# Patient Record
Sex: Female | Born: 1965 | Race: White | Hispanic: No | Marital: Married | Smoking: Former smoker
Health system: Southern US, Community
[De-identification: ages and names within clinical notes are randomized; demographics above are authoritative.]

## PROBLEM LIST (undated history)

## (undated) DIAGNOSIS — E039 Hypothyroidism, unspecified: Secondary | ICD-10-CM

## (undated) DIAGNOSIS — B2790 Infectious mononucleosis, unspecified without complication: Secondary | ICD-10-CM

## (undated) DIAGNOSIS — F329 Major depressive disorder, single episode, unspecified: Secondary | ICD-10-CM

## (undated) DIAGNOSIS — F32A Depression, unspecified: Secondary | ICD-10-CM

## (undated) DIAGNOSIS — G43909 Migraine, unspecified, not intractable, without status migrainosus: Secondary | ICD-10-CM

## (undated) DIAGNOSIS — K219 Gastro-esophageal reflux disease without esophagitis: Secondary | ICD-10-CM

## (undated) HISTORY — DX: Migraine, unspecified, not intractable, without status migrainosus: G43.909

## (undated) HISTORY — DX: Depression, unspecified: F32.A

## (undated) HISTORY — DX: Major depressive disorder, single episode, unspecified: F32.9

## (undated) HISTORY — DX: Infectious mononucleosis, unspecified without complication: B27.90

## (undated) HISTORY — PX: SPINE SURGERY: SHX786

## (undated) HISTORY — DX: Gastro-esophageal reflux disease without esophagitis: K21.9

## (undated) HISTORY — PX: BREAST SURGERY: SHX581

## (undated) HISTORY — DX: Hypothyroidism, unspecified: E03.9

---

## 1999-04-19 ENCOUNTER — Encounter: Payer: Self-pay | Admitting: Emergency Medicine

## 1999-04-19 ENCOUNTER — Emergency Department (HOSPITAL_COMMUNITY): Admission: EM | Admit: 1999-04-19 | Discharge: 1999-04-19 | Payer: Self-pay | Admitting: Emergency Medicine

## 1999-04-21 ENCOUNTER — Encounter: Payer: Self-pay | Admitting: Neurosurgery

## 1999-04-23 ENCOUNTER — Inpatient Hospital Stay (HOSPITAL_COMMUNITY): Admission: RE | Admit: 1999-04-23 | Discharge: 1999-04-23 | Payer: Self-pay | Admitting: Neurosurgery

## 1999-04-23 ENCOUNTER — Encounter: Payer: Self-pay | Admitting: Neurosurgery

## 1999-05-18 ENCOUNTER — Ambulatory Visit (HOSPITAL_COMMUNITY): Admission: RE | Admit: 1999-05-18 | Discharge: 1999-05-18 | Payer: Self-pay | Admitting: Neurosurgery

## 1999-05-18 ENCOUNTER — Encounter: Payer: Self-pay | Admitting: Neurosurgery

## 1999-06-03 ENCOUNTER — Encounter: Payer: Self-pay | Admitting: Neurosurgery

## 1999-06-03 ENCOUNTER — Ambulatory Visit (HOSPITAL_COMMUNITY): Admission: RE | Admit: 1999-06-03 | Discharge: 1999-06-03 | Payer: Self-pay | Admitting: Neurosurgery

## 2000-04-07 ENCOUNTER — Encounter: Payer: Self-pay | Admitting: Obstetrics & Gynecology

## 2000-04-07 ENCOUNTER — Ambulatory Visit (HOSPITAL_COMMUNITY): Admission: RE | Admit: 2000-04-07 | Discharge: 2000-04-07 | Payer: Self-pay | Admitting: Obstetrics & Gynecology

## 2001-08-14 ENCOUNTER — Other Ambulatory Visit: Admission: RE | Admit: 2001-08-14 | Discharge: 2001-08-14 | Payer: Self-pay | Admitting: Gynecology

## 2002-10-04 ENCOUNTER — Inpatient Hospital Stay (HOSPITAL_COMMUNITY): Admission: AD | Admit: 2002-10-04 | Discharge: 2002-10-07 | Payer: Self-pay | Admitting: Obstetrics & Gynecology

## 2002-10-08 ENCOUNTER — Encounter: Admission: RE | Admit: 2002-10-08 | Discharge: 2002-11-07 | Payer: Self-pay | Admitting: Obstetrics and Gynecology

## 2006-01-13 ENCOUNTER — Ambulatory Visit (HOSPITAL_COMMUNITY): Admission: RE | Admit: 2006-01-13 | Discharge: 2006-01-13 | Payer: Self-pay | Admitting: Obstetrics and Gynecology

## 2006-01-19 ENCOUNTER — Ambulatory Visit (HOSPITAL_COMMUNITY): Admission: RE | Admit: 2006-01-19 | Discharge: 2006-01-19 | Payer: Self-pay | Admitting: Obstetrics and Gynecology

## 2006-01-19 ENCOUNTER — Encounter (INDEPENDENT_AMBULATORY_CARE_PROVIDER_SITE_OTHER): Payer: Self-pay | Admitting: Specialist

## 2006-05-24 ENCOUNTER — Ambulatory Visit: Payer: Self-pay | Admitting: Family Medicine

## 2006-10-03 ENCOUNTER — Ambulatory Visit: Payer: Self-pay | Admitting: Family Medicine

## 2006-12-05 ENCOUNTER — Ambulatory Visit: Payer: Self-pay | Admitting: Family Medicine

## 2006-12-05 DIAGNOSIS — J45909 Unspecified asthma, uncomplicated: Secondary | ICD-10-CM | POA: Insufficient documentation

## 2006-12-21 ENCOUNTER — Ambulatory Visit: Payer: Self-pay | Admitting: Family Medicine

## 2007-01-30 ENCOUNTER — Ambulatory Visit: Payer: Self-pay | Admitting: Family Medicine

## 2007-02-25 IMAGING — US US OB TRANSVAGINAL
1 series · 14 of 21 positions shown · non-contrast
Comparison: none

CLINICAL DATA: Confirm spontaneous abortion; EGA by LMP is 8 weeks 2 days.
 TRANSVAGINAL OBSTETRICAL US:
TECHNIQUE: Transvaginal ultrasound was performed for evaluation of the gestation as well as the maternal uterus and adnexal regions.

[Series 1: us ob transvaginal · 0.19mm/px · 14 of 21 slices shown]
[im 1/21]
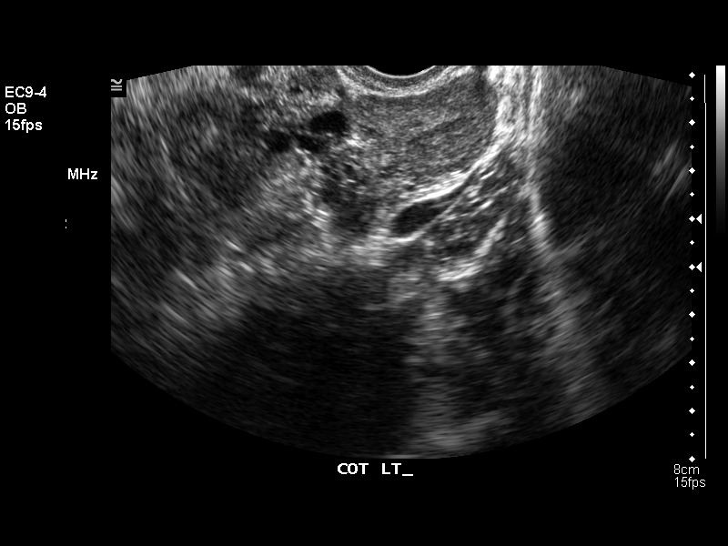
[im 3/21]
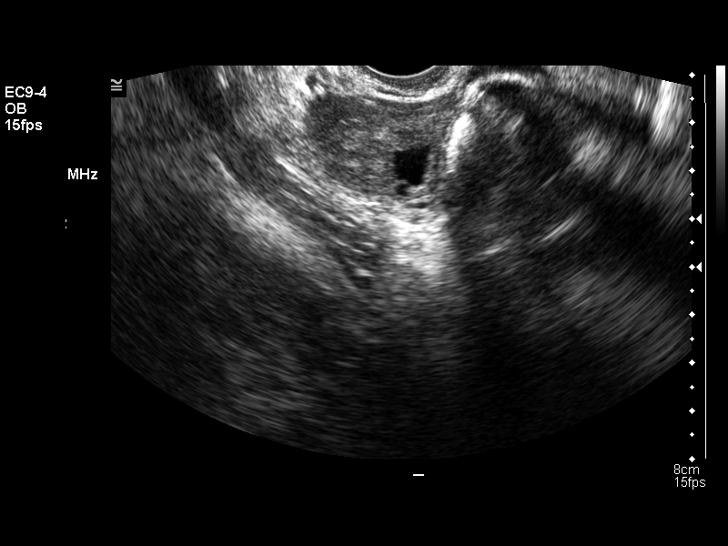
[im 4/21]
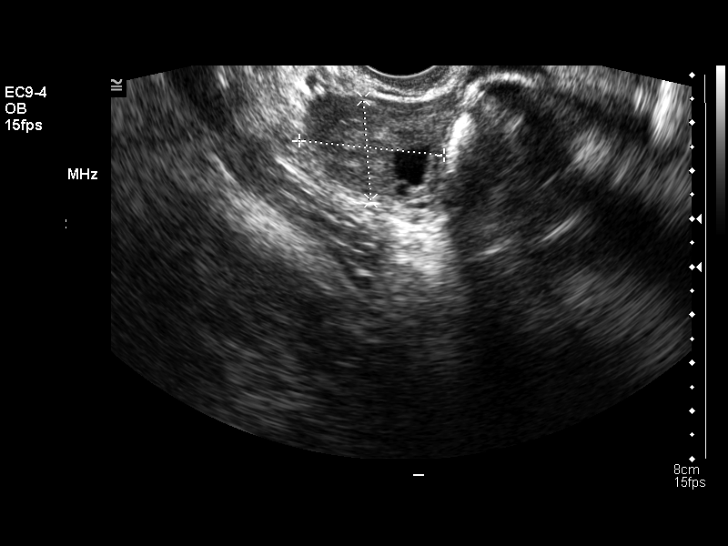
[im 6/21]
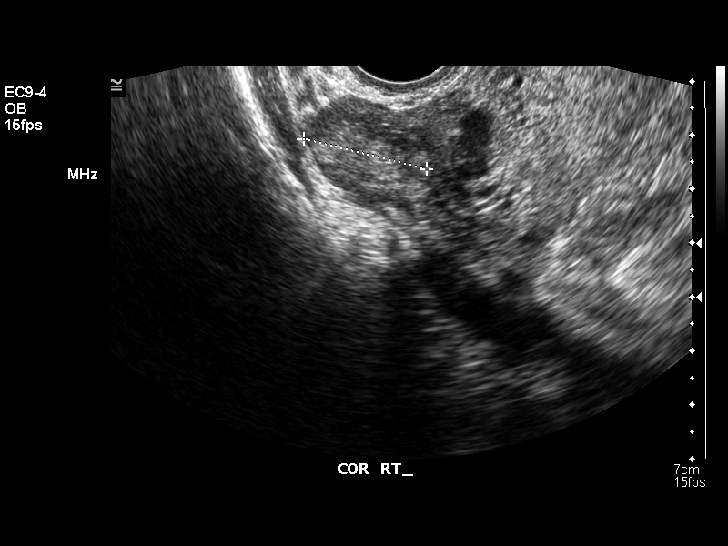
[im 7/21]
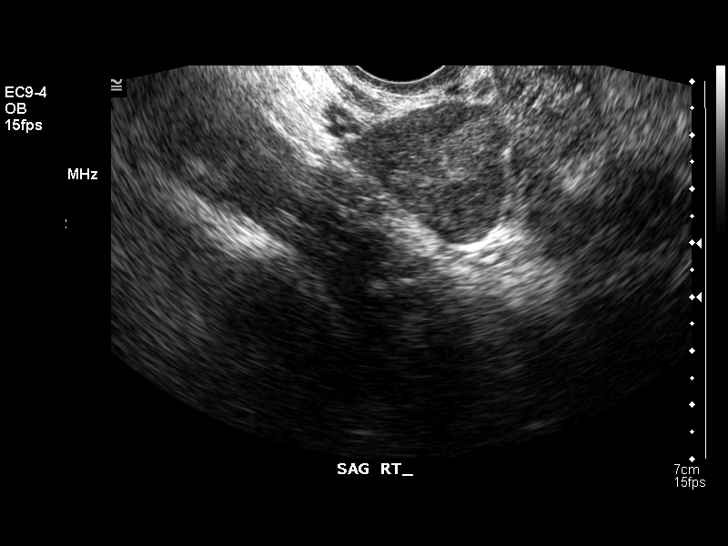
[im 9/21]
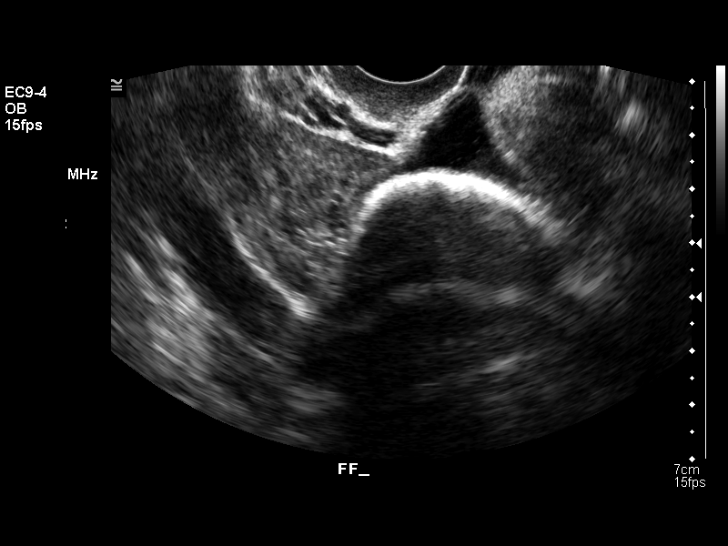
[im 10/21]
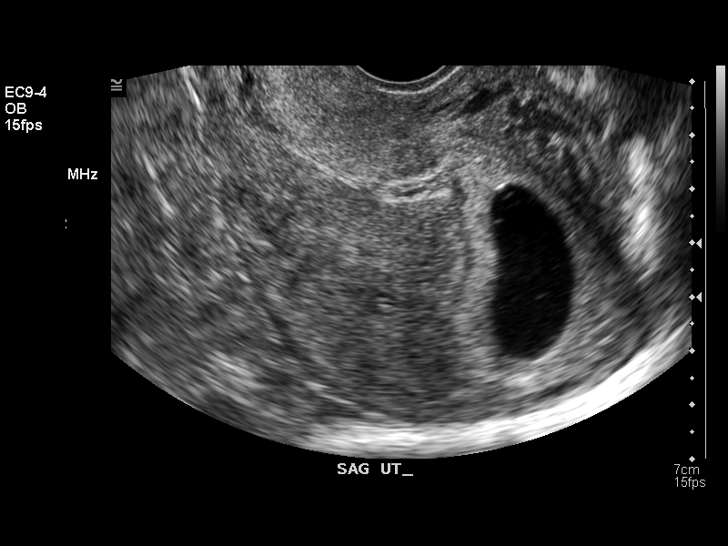
[im 12/21]
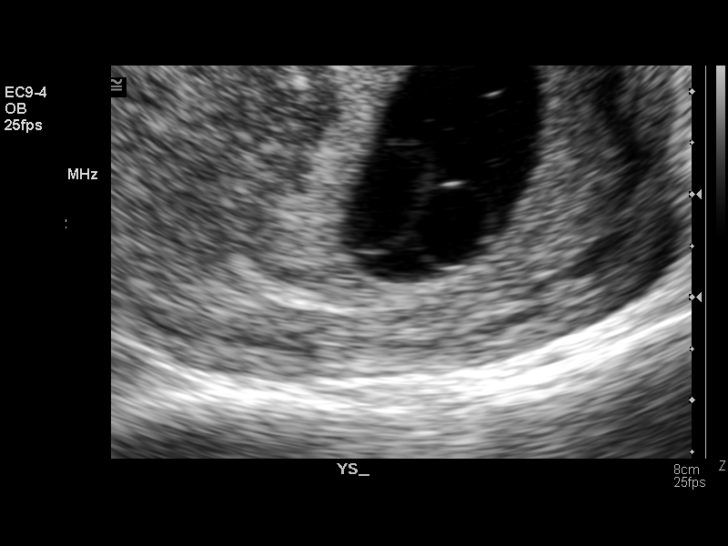
[im 13/21]
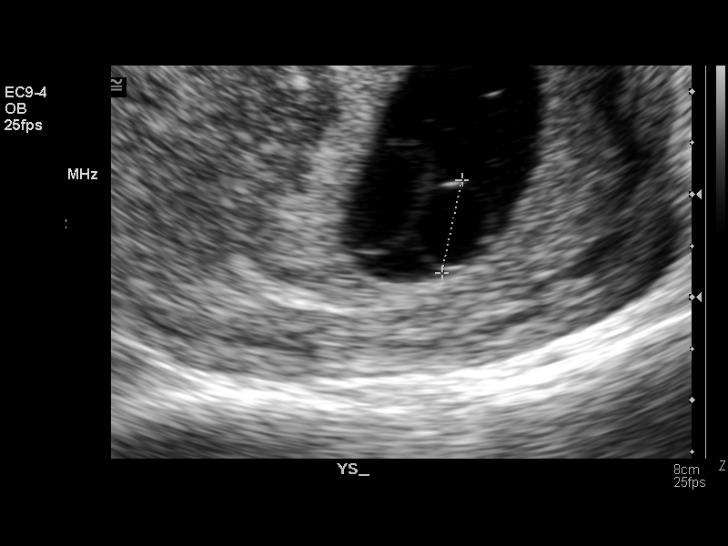
[im 15/21]
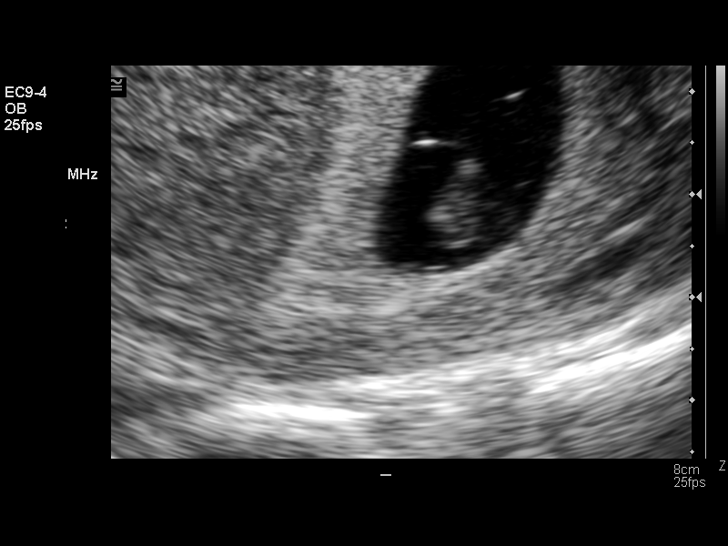
[im 16/21]
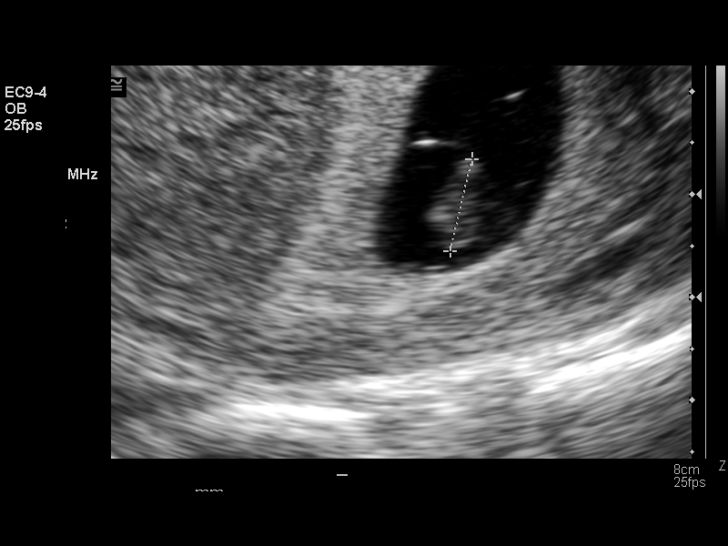
[im 18/21]
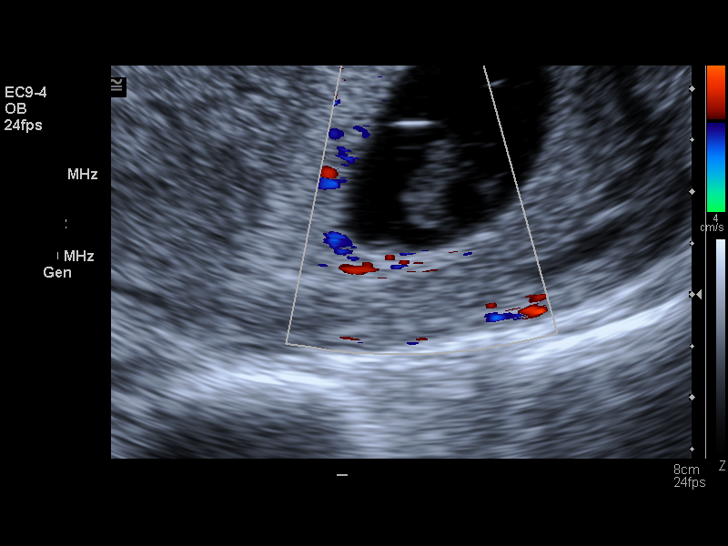
[im 19/21]
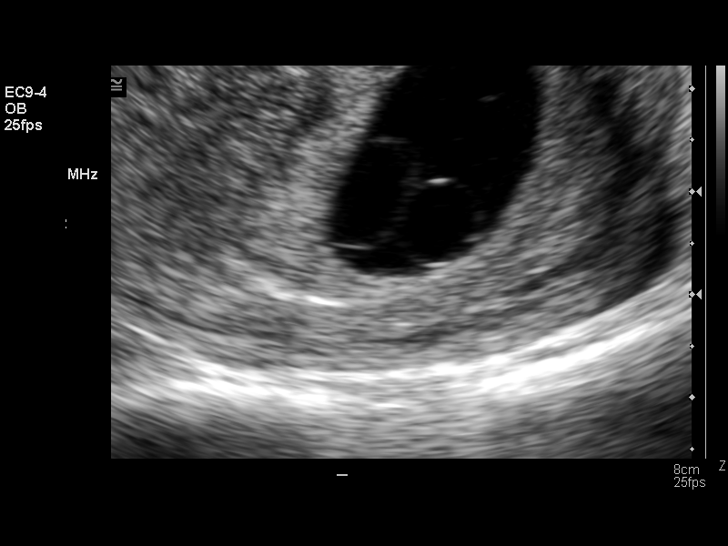
[im 21/21]
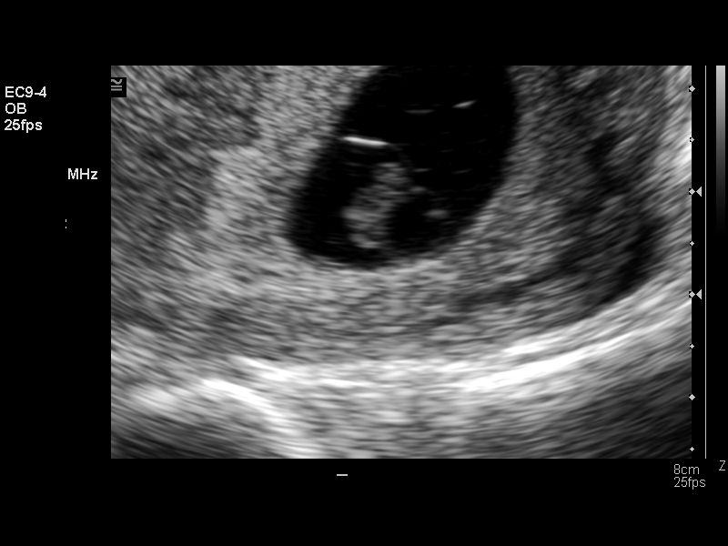

[14 of 21 positions shown; findings below may reference images not displayed]

FINDINGS: There is an intrauterine fetal pole with crown-rump length indicating a gestational age of 7 weeks 0 days.  No fetal cardiac activity is seen.  The yolk sac is abnormally enlarged, measuring approximately 1 cm.
IMPRESSION: Confirmed fetal demise.

## 2007-04-20 ENCOUNTER — Telehealth: Payer: Self-pay | Admitting: Family Medicine

## 2007-04-25 ENCOUNTER — Telehealth: Payer: Self-pay | Admitting: Family Medicine

## 2007-08-22 ENCOUNTER — Telehealth: Payer: Self-pay | Admitting: Family Medicine

## 2007-09-07 ENCOUNTER — Telehealth: Payer: Self-pay | Admitting: Family Medicine

## 2007-10-05 ENCOUNTER — Telehealth: Payer: Self-pay | Admitting: Family Medicine

## 2008-02-08 ENCOUNTER — Telehealth: Payer: Self-pay | Admitting: Family Medicine

## 2008-02-09 ENCOUNTER — Telehealth: Payer: Self-pay | Admitting: Family Medicine

## 2008-03-20 ENCOUNTER — Ambulatory Visit: Payer: Self-pay | Admitting: Family Medicine

## 2008-04-04 ENCOUNTER — Telehealth: Payer: Self-pay | Admitting: Family Medicine

## 2008-12-09 ENCOUNTER — Ambulatory Visit: Payer: Self-pay | Admitting: Family Medicine

## 2008-12-09 DIAGNOSIS — J029 Acute pharyngitis, unspecified: Secondary | ICD-10-CM | POA: Insufficient documentation

## 2008-12-09 LAB — CONVERTED CEMR LAB: Rapid Strep: NEGATIVE

## 2009-01-16 ENCOUNTER — Telehealth (INDEPENDENT_AMBULATORY_CARE_PROVIDER_SITE_OTHER): Payer: Self-pay | Admitting: *Deleted

## 2009-01-19 ENCOUNTER — Telehealth: Payer: Self-pay | Admitting: Family Medicine

## 2010-07-06 ENCOUNTER — Other Ambulatory Visit: Payer: Self-pay | Admitting: Family Medicine

## 2010-07-08 ENCOUNTER — Telehealth: Payer: Self-pay | Admitting: *Deleted

## 2010-07-08 NOTE — Telephone Encounter (Signed)
Refill on hydrcodone-acet 5/500 #60 last filled on 04/01/10

## 2010-07-09 NOTE — Telephone Encounter (Signed)
No refills, she needs an OV

## 2010-07-09 NOTE — Telephone Encounter (Signed)
Request faxed to [facility] refill denied for hydrocodone

## 2010-09-03 NOTE — Assessment & Plan Note (Signed)
[facility]                            [facility] OFFICE NOTE   NAME:Sarah Gay, Sarah Gay                        MRN:          [id number]  DATE:05/24/2006                            DOB:          1966/01/05    This is a 45 year old woman here to establish with our practice, who is  also complaining of a couple problems.  First off, 5 weeks ago she began  having a constant sore throat, and this has continued to bother her ever  since.  She has some mild sinus pressure and some post-nasal drainage.  She occasionally has an unproductive cough with it.  There has been no  fever at all.  She also describes sores in her mouth and both cheeks  just behind the corners of her mouth.  She saw her previous doctor  several weeks ago and was given a Z-Pak.  This did not help her symptoms  at all.  For the past couple of weeks, she has been using Allegra on a  daily basis, and also Magic Mouthwash in an attempt to help the sore  places in her mouth.  None of this has been effective either.   PAST MEDICAL HISTORY:  She is a G 3, P 1 having had 1 vaginal Delivery.  Her child is now 54 years old.  This was the result of a fertility  treatment, as she had an infertility workup prior to that.  Currently,  she and her husband are now also involved with Dr. Chevis Pretty for  infertility, and she is taking fertility drugs again.  The patient  states that she had mononucleosis years ago when younger.  She says her  78-year-old daughter has had a mild fever and a mild sore throat for the  past couple of days as well.   PAST MEDICAL HISTORY:  Includes asthma, which she grew out of, as a  child.  She has had surgery to repair a C7 cervical disk.  She has had  bilateral breast augmentations.   ALLERGIES:  SULFA.   MEDICATIONS:  Allegra 180 mg per day.   HABITS:  She does not use tobacco or alcohol.   SOCIAL HISTORY:  She is married with 1 child.  She is a English as a second language teacher for  [employer].   FAMILY HISTORY:  Remarkable for heart disease and breast cancer.   OBJECTIVE:  Height 5 feet 6 inches, weight 139, BP 98/82, pulse 72 and  regular, temperature 98.7 degrees.  GENERAL:  She appears healthy.  She is in no distress.  EYES:  Clear.  OROPHARYNX:  Clear, except for a small cluster of tiny white papular  lesions on both cheeks just behind the corners of her mouth.  NECK:  Supple without lymphadenopathy or masses.  LUNGS:  Clear.   ASSESSMENT AND PLAN:  Problem #1:  Upper respiratory infection, which is  probably viral, but could represent a low grade sinusitis.  Will cover  with Augmentin 875 mg b.i.d. for 7 days.  She will stop taking the  Allegra and will increase her fluid intake.  She  will follow up as  needed.  Problem #2:  Chronic back pain.  She keeps a small supply of Vicoprofen  around the house to use as needed.  She asks for a refill today, so I  wrote for Vicoprofen 7.5/200 to take as needed for pain #30 with no  refills.     Tera Mater. Clent Ridges, MD  Electronically Signed    SAF/MedQ  DD: 05/24/2006  DT: 05/24/2006  Job #: [id number]

## 2010-09-03 NOTE — Op Note (Signed)
NAMEJENAVIEVE, Sarah Gay                 ACCOUNT NO.:  [id number]   MEDICAL RECORD NO.:  [id number]          PATIENT TYPE:  AMB   LOCATION:  SDC                           FACILITY:  [facility]   PHYSICIAN:  Randye Lobo, M.D.   DATE OF BIRTH:  09/03/1965   DATE OF PROCEDURE:  01/19/2006  DATE OF DISCHARGE:                                 OPERATIVE REPORT   PREOPERATIVE DIAGNOSIS:  Intrauterine gestation at 9 weeks, missed abortion.   POSTOPERATIVE DIAGNOSIS:  Intrauterine gestation at 9 weeks, missed  abortion.   PROCEDURE:  Dilation and evacuation   SURGEON:  Conley Simmonds, MD   ANESTHESIA:  MAC, paracervical block with 1% lidocaine.   IV FLUIDS:  800 mL Ringer's lactate.   ESTIMATED BLOOD LOSS:  Minimal.   URINE OUTPUT:  20 mL.   COMPLICATIONS:  None.   INDICATIONS FOR PROCEDURE:  The patient is a 45 year old Caucasian female  with a last menstrual period November 16, 2005, who conceived a pregnancy with  gonadotrophins and intrauterine insemination, who presented to the office  for a routine antepartum visit 6 days ago and was noted to have a missed  abortion on ultrasound.  The patient had been previously followed by her  infertility specialist and had a transvaginal ultrasound previously  documenting a viable pregnancy.  A plan is made now to proceed with a  dilation and evacuation procedure after risks, benefits, and alternatives  are discussed.   FINDINGS:  Examination under anesthesia revealed an 8-9 weeks' size  retroverted uterus.  A small amount of products of conception were obtained.   SPECIMEN:  Products of conception were sent to pathology.   PROCEDURE:  The patient was reidentified in the preoperative hold area.  The  patient received Ancef 1 gram IV for antibiotic prophylaxis.  The patient  was taken to the operating room where MAC anesthesia was induced.  The  patient was placed in the dorsal lithotomy position.  The vagina was  sterilely prepped, and the bladder  was catheterized of urine.  The patient  was sterilely draped.   An exam under anesthesia was performed.  A speculum was then placed in the  vagina and a single-tooth tenaculum placed on the anterior cervical lip.  Paracervical block was performed in standard fashion with a total of 10 mL  of 1% lidocaine.  With a single-tooth tenaculum on the anterior cervical  lip, the uterus was then sounded to 9 cm.  The cervix was then dilated with  [facility] dilators.  A #9 suction tip curette was then introduced through the  cervical os to the level of the uterine fundus and withdrawn slightly.  Proper pressure was applied, and the evacuation was performed by withdrawing  the suction tip curette while turning it in a clockwise fashion.  This was  repeated 2 additional times.  Sharp curettage was then performed gently in  all 4 quadrants, and no remaining products of conception were appreciated.  The suction tip curette was then inserted into the uterus 1 final time to  remove any remaining blood clots.  Hemostasis was noted to be good at this  time.  The procedure was completed.  All of the vaginal instruments were  removed.   The patient was cleansed of Betadine, and she was awakened and taken to the  recovery room in stable and awake condition.  There were no complications to  the procedure.  All needle, instrument, and sponge counts were correct.      Randye Lobo, M.D.  Electronically Signed     BES/MEDQ  D:  01/19/2006  T:  01/20/2006  Job:  [id number]   cc:   Leatha Gilding. Mezer, M.D.  Fax: [phone]

## 2010-09-03 NOTE — Op Note (Signed)
NAME:  Sarah Gay, Sarah Gay                           ACCOUNT NO.:  [id number]   MEDICAL RECORD NO.:  [id number]                   PATIENT TYPE:  INP   LOCATION:  9168                                 FACILITY:  [facility]   PHYSICIAN:  Malva Limes, M.D.                 DATE OF BIRTH:  1965/09/14   DATE OF PROCEDURE:  10/04/2002  DATE OF DISCHARGE:                                 OPERATIVE REPORT   PREOPERATIVE DIAGNOSES:  1. Intrauterine pregnancy at term.  2. Breech presentation.  3. Active labor.  4. Variable decelerations.   POSTOPERATIVE DIAGNOSES:  1. Intrauterine pregnancy at term.  2. Breech presentation.  3. Active labor.  4. Variable decelerations.  5. Nuchal cord x1.   PROCEDURE:  Primary low transverse cesarean section.   SURGEON:  Malva Limes, M.D.   ANESTHESIA:  Epidural.   ANTIBIOTICS:  Cefotan 1 g.   DRAINS:  Foley to bedside drainage.   ESTIMATED BLOOD LOSS:  900 mL.   COMPLICATIONS:  None.   SPECIMENS:  None.   FINDINGS:  The patient had normal fallopian tubes and ovaries.  The uterus  was normal with no evidence of any kind of uterine abnormalities.  There was  no evidence of a septum.  The patient delivered one live viable white female  infant weighing 6 pounds 7 ounces, Apgars 8 at one minute and 9 at five  minutes.   INDICATIONS:  Ms. Blubaugh is a 45 year old white female who is discovered to  be in active labor with frank breech presentation at 5 cm.  A discussion was  taken with the patient regarding her options, which included a vaginal  breech delivery, attempt at external version, and a primary cesarean  section.  The patient and I elected to proceed with a primary cesarean  section.  Initially the operating room was called and there was a delay  because of several C-sections going on at the time.  The patient progressed  to complete-complete over the next hour and began to have deep variable  decelerations.  At that point she was taken to the  operating room promptly,  where she was placed in the dorsal supine position with a left lateral tilt  and a C-section begun.   PROCEDURE:  The patient had a Pfannenstiel incision made.  This was carried  down to the fascia.  The fascia was entered in the midline and extended  laterally with the Mayo scissors.  The rectus muscles were dissected from  the fascia with the Mayos.  The rectus muscle was divided in the midline and  taken superiorly and inferiorly.  The parietal peritoneum was entered  bluntly and taken superiorly and inferiorly.  The bladder flap was taken  down sharply.  A low transverse uterine incision was made in the midline and  extended laterally.  Amniotic sac was entered.  The infant was delivered in  the breech presentation.  There was a nuchal cord x1, which was reduced, the  cord doubly clamped and cut, and the oropharynx and nostrils bulb-suctioned.  The infant was then taken to the neonatal intensive care team.  Cord blood  was then obtained.  The placenta was manually removed.  The uterine cavity  was wiped with a wet lap.  An examination of the uterine cavity revealed no  abnormalities.  The uterine incision was closed in a single layer of 0  chromic in a running locking fashion.  There was an area of bleeding in the  left margin, which was made hemostatic using 0 Monocryl suture in  interrupted figure-of-eight fashion.  The bladder flap was closed using 3-0  chromic in a running fashion.  The uterus was placed back into the abdominal  cavity.  Hemostasis was checked and felt to be adequate.  The parietal  peritoneum and the rectus muscles were reapproximated in the midline using 3-  0 chromic in a running fashion.  The fascia was closed using an 0 Monocryl  suture in a running fashion.  The subcuticular tissue was made hemostatic  with the Bovie.  Stainless steel clips were used to close the skin.  The  patient tolerated the procedure well.  She was taken to the  recovery room in  stable condition.  Instrument and lap counts were correct x2.                                               Malva Limes, M.D.    MA/MEDQ  D:  10/04/2002  T:  10/05/2002  Job:  [id number]

## 2010-09-09 ENCOUNTER — Ambulatory Visit (INDEPENDENT_AMBULATORY_CARE_PROVIDER_SITE_OTHER): Payer: 59 | Admitting: Family Medicine

## 2010-09-09 ENCOUNTER — Encounter: Payer: Self-pay | Admitting: Family Medicine

## 2010-09-09 VITALS — BP 110/76 | HR 87 | Temp 98.4°F | Resp 14 | Wt 128.0 lb

## 2010-09-09 DIAGNOSIS — G4700 Insomnia, unspecified: Secondary | ICD-10-CM

## 2010-09-09 DIAGNOSIS — F329 Major depressive disorder, single episode, unspecified: Secondary | ICD-10-CM

## 2010-09-09 DIAGNOSIS — F32A Depression, unspecified: Secondary | ICD-10-CM

## 2010-09-09 DIAGNOSIS — G43909 Migraine, unspecified, not intractable, without status migrainosus: Secondary | ICD-10-CM

## 2010-09-09 MED ORDER — ZOLPIDEM TARTRATE ER 12.5 MG PO TBCR: 12.5000 mg | EXTENDED_RELEASE_TABLET | Freq: Every evening | ORAL | Status: DC | PRN

## 2010-09-09 MED ORDER — FLUOXETINE HCL 20 MG PO TABS: 40.0000 mg | ORAL_TABLET | Freq: Every day | ORAL | Status: DC

## 2010-09-09 MED ORDER — OMEPRAZOLE 40 MG PO CPDR: 40.0000 mg | DELAYED_RELEASE_CAPSULE | Freq: Every day | ORAL | Status: DC

## 2010-09-09 MED ORDER — HYDROCODONE-ACETAMINOPHEN 5-500 MG PO TABS: 1.0000 | ORAL_TABLET | ORAL | Status: DC | PRN

## 2010-09-09 MED ORDER — SUMATRIPTAN SUCCINATE 100 MG PO TABS: 100.0000 mg | ORAL_TABLET | ORAL | Status: DC | PRN

## 2010-09-09 MED ORDER — LORAZEPAM 0.5 MG PO TABS: 0.5000 mg | ORAL_TABLET | Freq: Four times a day (QID) | ORAL | Status: DC | PRN

## 2010-09-09 NOTE — Progress Notes (Signed)
  Subjective:    Patient ID: Sarah Gay, female    DOB: 1965-06-08, 45 y.o.   MRN: [id number]  HPI Here to follow up on migraines, anxiety, and insomnia. She is doing well but needs refills.    Review of Systems  Constitutional: Negative.   Respiratory: Negative.   Cardiovascular: Negative.   Neurological: Positive for headaches.  Psychiatric/Behavioral: The patient is nervous/anxious.        Objective:   Physical Exam  Constitutional: She is oriented to person, place, and time. She appears well-developed and well-nourished.  Cardiovascular: Normal rate, regular rhythm, normal heart sounds and intact distal pulses.   Pulmonary/Chest: Effort normal and breath sounds normal.  Neurological: She is alert and oriented to person, place, and time.  Psychiatric: She has a normal mood and affect. Her behavior is normal.          Assessment & Plan:  meds refilled. She will return son for a cpx with labs

## 2010-11-17 ENCOUNTER — Telehealth: Payer: Self-pay | Admitting: Family Medicine

## 2010-11-17 NOTE — Telephone Encounter (Signed)
done

## 2010-11-17 NOTE — Telephone Encounter (Signed)
Pt will be having her cpx on 12-24-2010 and would like to have her labs drawn at her job. Please send over an order for cpx labs---v70.0, diagnosed codes. Fax to [phone]. Call pt before faxing. Thanks.

## 2010-11-17 NOTE — Telephone Encounter (Signed)
Labs faxed and pt aware 

## 2010-11-18 ENCOUNTER — Encounter: Payer: 59 | Admitting: Family Medicine

## 2010-12-21 ENCOUNTER — Telehealth: Payer: Self-pay | Admitting: Family Medicine

## 2010-12-21 NOTE — Telephone Encounter (Signed)
I spoke with pt and gave results.  

## 2010-12-24 ENCOUNTER — Ambulatory Visit (INDEPENDENT_AMBULATORY_CARE_PROVIDER_SITE_OTHER): Payer: 59 | Admitting: Family Medicine

## 2010-12-24 ENCOUNTER — Encounter: Payer: Self-pay | Admitting: Family Medicine

## 2010-12-24 VITALS — BP 102/60 | HR 101 | Temp 98.5°F | Ht 65.25 in | Wt 126.0 lb

## 2010-12-24 DIAGNOSIS — Z0000 Encounter for general adult medical examination without abnormal findings: Secondary | ICD-10-CM

## 2010-12-24 NOTE — Progress Notes (Signed)
  Subjective:    Patient ID: Sarah Gay, female    DOB: June 30, 1965, 45 y.o.   MRN: [id number]  HPI 45 yr old female for a cpx. She feels well and has no complaints.    Review of Systems  Constitutional: Negative.   HENT: Negative.   Eyes: Negative.   Respiratory: Negative.   Cardiovascular: Negative.   Gastrointestinal: Negative.   Genitourinary: Negative for dysuria, urgency, frequency, hematuria, flank pain, decreased urine volume, enuresis, difficulty urinating, pelvic pain and dyspareunia.  Musculoskeletal: Negative.   Skin: Negative.   Neurological: Negative.   Hematological: Negative.   Psychiatric/Behavioral: Negative.        Objective:   Physical Exam  Constitutional: She is oriented to person, place, and time. She appears well-developed and well-nourished. No distress.  HENT:  Head: Normocephalic and atraumatic.  Right Ear: External ear normal.  Left Ear: External ear normal.  Nose: Nose normal.  Mouth/Throat: Oropharynx is clear and moist. No oropharyngeal exudate.  Eyes: Conjunctivae and EOM are normal. Pupils are equal, round, and reactive to light. No scleral icterus.  Neck: Normal range of motion. Neck supple. No JVD present. No thyromegaly present.  Cardiovascular: Normal rate, regular rhythm, normal heart sounds and intact distal pulses.  Exam reveals no gallop and no friction rub.   No murmur heard. Pulmonary/Chest: Effort normal and breath sounds normal. No respiratory distress. She has no wheezes. She has no rales. She exhibits no tenderness.  Abdominal: Soft. Bowel sounds are normal. She exhibits no distension and no mass. There is no tenderness. There is no rebound and no guarding.  Musculoskeletal: Normal range of motion. She exhibits no edema and no tenderness.  Lymphadenopathy:    She has no cervical adenopathy.  Neurological: She is alert and oriented to person, place, and time. She has normal reflexes. No cranial nerve deficit. She exhibits normal  muscle tone. Coordination normal.  Skin: Skin is warm and dry. No rash noted. No erythema.  Psychiatric: She has a normal mood and affect. Her behavior is normal. Judgment and thought content normal.          Assessment & Plan:  Well exam

## 2011-04-30 ENCOUNTER — Other Ambulatory Visit: Payer: Self-pay | Admitting: Family Medicine

## 2011-05-02 ENCOUNTER — Telehealth: Payer: Self-pay | Admitting: Family Medicine

## 2011-05-02 NOTE — Telephone Encounter (Signed)
Refill request for Vicodin 5-500 mg take 1 po q4hrs prn and pt last here on 12/24/10.

## 2011-05-03 MED ORDER — HYDROCODONE-ACETAMINOPHEN 5-500 MG PO TABS: 1.0000 | ORAL_TABLET | ORAL | Status: DC | PRN

## 2011-05-03 NOTE — Telephone Encounter (Signed)
Call in #60 with 5 rf 

## 2011-05-03 NOTE — Telephone Encounter (Signed)
Rx called in 

## 2011-05-04 NOTE — Telephone Encounter (Signed)
What is this about?

## 2011-07-01 ENCOUNTER — Other Ambulatory Visit: Payer: Self-pay | Admitting: Family Medicine

## 2011-07-03 NOTE — Telephone Encounter (Signed)
Call in #60 with 5 rf 

## 2011-10-13 ENCOUNTER — Other Ambulatory Visit: Payer: Self-pay | Admitting: Family Medicine

## 2011-10-14 ENCOUNTER — Encounter: Payer: Self-pay | Admitting: Family Medicine

## 2011-10-14 ENCOUNTER — Ambulatory Visit (INDEPENDENT_AMBULATORY_CARE_PROVIDER_SITE_OTHER): Payer: 59 | Admitting: Family Medicine

## 2011-10-14 VITALS — BP 102/70 | HR 114 | Temp 98.6°F | Wt 125.0 lb

## 2011-10-14 DIAGNOSIS — J40 Bronchitis, not specified as acute or chronic: Secondary | ICD-10-CM

## 2011-10-14 MED ORDER — SUMATRIPTAN SUCCINATE 100 MG PO TABS: 100.0000 mg | ORAL_TABLET | Freq: Once | ORAL | Status: DC | PRN

## 2011-10-14 MED ORDER — AMOXICILLIN-POT CLAVULANATE 875-125 MG PO TABS: 1.0000 | ORAL_TABLET | Freq: Two times a day (BID) | ORAL | Status: AC

## 2011-10-14 NOTE — Progress Notes (Signed)
  Subjective:    Patient ID: Sarah Gay, female    DOB: 06-21-1965, 46 y.o.   MRN: [id number]  HPI Here for 2 weeks of chest tightness, coughing up green sputum, and head congestion.   Review of Systems  Constitutional: Negative.   HENT: Positive for congestion, postnasal drip and sinus pressure.   Eyes: Negative.   Respiratory: Positive for cough and chest tightness.        Objective:   Physical Exam  Constitutional: She appears well-developed and well-nourished.  HENT:  Right Ear: External ear normal.  Left Ear: External ear normal.  Nose: Nose normal.  Mouth/Throat: Oropharynx is clear and moist. No oropharyngeal exudate.  Eyes: Conjunctivae are normal.  Neck: No thyromegaly present.  Pulmonary/Chest: Effort normal. No respiratory distress. She has no wheezes. She has no rales.       Scattered rhonchi   Lymphadenopathy:    She has no cervical adenopathy.          Assessment & Plan:  Add Mucinex

## 2011-11-07 ENCOUNTER — Other Ambulatory Visit: Payer: Self-pay | Admitting: Family Medicine

## 2011-12-16 ENCOUNTER — Other Ambulatory Visit: Payer: Self-pay | Admitting: Family Medicine

## 2011-12-20 ENCOUNTER — Telehealth: Payer: Self-pay | Admitting: *Deleted

## 2011-12-20 NOTE — Telephone Encounter (Signed)
Patient is requesting a refill of hydrocodone - acetaminophen 5/500 last office visit 10/14/11. Is this okay to fill?

## 2011-12-20 NOTE — Telephone Encounter (Signed)
Call in #60 with 5 rf 

## 2011-12-21 MED ORDER — HYDROCODONE-ACETAMINOPHEN 5-500 MG PO TABS: 1.0000 | ORAL_TABLET | ORAL | Status: DC | PRN

## 2011-12-21 NOTE — Telephone Encounter (Signed)
Rx called in to pharmacy. 

## 2011-12-21 NOTE — Telephone Encounter (Signed)
Call in #60 with 5 rf 

## 2012-03-06 ENCOUNTER — Other Ambulatory Visit: Payer: Self-pay | Admitting: Family Medicine

## 2012-03-07 NOTE — Telephone Encounter (Signed)
Med filled.  

## 2012-03-23 ENCOUNTER — Telehealth: Payer: Self-pay | Admitting: Family Medicine

## 2012-03-23 NOTE — Telephone Encounter (Signed)
Refill request for Omeprazole and send to [facility] ( 90 day supply )

## 2012-03-26 ENCOUNTER — Telehealth: Payer: Self-pay | Admitting: Family Medicine

## 2012-03-26 MED ORDER — OMEPRAZOLE 40 MG PO CPDR: 40.0000 mg | DELAYED_RELEASE_CAPSULE | Freq: Every day | ORAL | Status: DC

## 2012-03-26 NOTE — Telephone Encounter (Signed)
Refill request for Omeprazole and send to [facility]. I did send script -scribe.

## 2012-03-27 NOTE — Telephone Encounter (Signed)
I sent script e-scribe. 

## 2012-07-05 ENCOUNTER — Other Ambulatory Visit: Payer: Self-pay | Admitting: Family Medicine

## 2012-07-06 MED ORDER — HYDROCODONE-ACETAMINOPHEN 5-325 MG PO TABS: 1.0000 | ORAL_TABLET | ORAL | Status: DC | PRN

## 2012-07-06 NOTE — Addendum Note (Signed)
Addended by: Aniceto Boss A on: 07/06/2012 01:28 PM   Modules accepted: Orders, Medications

## 2012-07-06 NOTE — Telephone Encounter (Signed)
Change this to 5/325, call in #60 with 5 rf

## 2012-08-25 ENCOUNTER — Other Ambulatory Visit: Payer: Self-pay | Admitting: Family Medicine

## 2012-08-30 NOTE — Telephone Encounter (Signed)
Okay for a year of Sumatriptan. Okay for 6 months of Ambien

## 2013-02-08 ENCOUNTER — Telehealth: Payer: Self-pay | Admitting: Family Medicine

## 2013-02-08 MED ORDER — HYDROCODONE-ACETAMINOPHEN 5-325 MG PO TABS: 1.0000 | ORAL_TABLET | ORAL | Status: DC | PRN

## 2013-02-08 NOTE — Telephone Encounter (Signed)
Script is ready and I spoke with pt. 

## 2013-02-08 NOTE — Telephone Encounter (Signed)
done

## 2013-02-08 NOTE — Telephone Encounter (Signed)
Pt needs new rx hydrocodone °

## 2013-02-11 ENCOUNTER — Other Ambulatory Visit: Payer: Self-pay | Admitting: Family Medicine

## 2013-02-28 ENCOUNTER — Encounter: Payer: Self-pay | Admitting: Family Medicine

## 2013-03-01 ENCOUNTER — Ambulatory Visit (INDEPENDENT_AMBULATORY_CARE_PROVIDER_SITE_OTHER): Payer: 59 | Admitting: Family Medicine

## 2013-03-01 ENCOUNTER — Encounter: Payer: Self-pay | Admitting: Family Medicine

## 2013-03-01 VITALS — BP 110/70 | HR 105 | Temp 98.3°F | Wt 117.0 lb

## 2013-03-01 DIAGNOSIS — F411 Generalized anxiety disorder: Secondary | ICD-10-CM

## 2013-03-01 DIAGNOSIS — F329 Major depressive disorder, single episode, unspecified: Secondary | ICD-10-CM

## 2013-03-01 MED ORDER — ALPRAZOLAM 0.5 MG PO TABS: 0.5000 mg | ORAL_TABLET | Freq: Three times a day (TID) | ORAL | Status: DC | PRN

## 2013-03-01 MED ORDER — ZOLPIDEM TARTRATE ER 12.5 MG PO TBCR: 12.5000 mg | EXTENDED_RELEASE_TABLET | Freq: Every evening | ORAL | Status: DC | PRN

## 2013-03-01 MED ORDER — FLUOXETINE HCL 40 MG PO CAPS: 40.0000 mg | ORAL_CAPSULE | Freq: Two times a day (BID) | ORAL | Status: DC

## 2013-03-01 NOTE — Progress Notes (Signed)
Pre visit review using our clinic review tool, if applicable. No additional management support is needed unless otherwise documented below in the visit note. 

## 2013-03-01 NOTE — Progress Notes (Signed)
  Subjective:    Patient ID: Sarah Gay, female    DOB: [birth year], 47 y.o.   MRN: [id number]  HPI Here for worsening depression. She has had some very difficult marital problems and her depression has gotten worse. She is very fatigued, has a poor appetite, and can't sleep. She is crying most of the time. She had been off Prozac for 3 years but several weeks ago she went back on this, taking 20 mg bid. She is using Lorazepam for the anxious spells but this is not working well.    Review of Systems  Constitutional: Negative.   Psychiatric/Behavioral: Positive for sleep disturbance, dysphoric mood and decreased concentration. Negative for suicidal ideas, confusion, self-injury and agitation. The patient is nervous/anxious.        Objective:   Physical Exam  Constitutional: She appears well-developed and well-nourished.  Psychiatric: Her behavior is normal. Thought content normal.  Tearful, depressed affect           Assessment & Plan:  We will increase Prozac to 40 mg bid. Switch from Lorazepam to Alprazolam 0.5 mg tid prn. Continue Zolpidem for sleep. Recheck in 2 weeks. I encouraged her to see a therapist as well.

## 2013-03-26 ENCOUNTER — Other Ambulatory Visit: Payer: Self-pay | Admitting: Family Medicine

## 2013-03-26 ENCOUNTER — Telehealth: Payer: Self-pay | Admitting: Family Medicine

## 2013-03-26 NOTE — Telephone Encounter (Signed)
Refill request for Prozac 40 mg take 1 po bid and send a 90 day supply to [facility].

## 2013-03-27 MED ORDER — FLUOXETINE HCL 40 MG PO CAPS: 40.0000 mg | ORAL_CAPSULE | Freq: Two times a day (BID) | ORAL | Status: DC

## 2013-03-27 NOTE — Telephone Encounter (Signed)
Refill for one year 

## 2013-03-27 NOTE — Telephone Encounter (Signed)
I sent script e-scribe. 

## 2013-06-05 ENCOUNTER — Other Ambulatory Visit: Payer: Self-pay | Admitting: Family Medicine

## 2013-06-05 NOTE — Telephone Encounter (Signed)
Call in #60 only, she needs testing and a contract  

## 2013-06-28 ENCOUNTER — Telehealth: Payer: Self-pay | Admitting: Family Medicine

## 2013-06-28 NOTE — Telephone Encounter (Signed)
Pt needs new rx hydrocodone °

## 2013-07-01 MED ORDER — HYDROCODONE-ACETAMINOPHEN 5-325 MG PO TABS: 1.0000 | ORAL_TABLET | ORAL | Status: DC | PRN

## 2013-07-01 NOTE — Telephone Encounter (Signed)
Script is ready for pick up, contract printed and I left a voice message for pt. 

## 2013-07-01 NOTE — Telephone Encounter (Signed)
Done for one month only. She needs testing and a contract  

## 2013-08-09 ENCOUNTER — Encounter: Payer: Self-pay | Admitting: Family Medicine

## 2013-08-23 ENCOUNTER — Telehealth: Payer: Self-pay | Admitting: Family Medicine

## 2013-08-23 MED ORDER — HYDROCODONE-ACETAMINOPHEN 5-325 MG PO TABS: 1.0000 | ORAL_TABLET | ORAL | Status: DC | PRN

## 2013-08-23 NOTE — Telephone Encounter (Signed)
Pt request rx HYDROcodone-acetaminophen (NORCO/VICODIN) 5-325 MG per tablet Pt states no hurry, giving you plenty of time

## 2013-08-23 NOTE — Telephone Encounter (Signed)
done

## 2013-08-26 NOTE — Telephone Encounter (Signed)
Script is ready for pick up and I spoke with pt.  

## 2013-09-23 ENCOUNTER — Other Ambulatory Visit: Payer: Self-pay | Admitting: Family Medicine

## 2013-09-25 ENCOUNTER — Other Ambulatory Visit: Payer: Self-pay | Admitting: Obstetrics and Gynecology

## 2013-09-26 LAB — CYTOLOGY - PAP

## 2013-10-28 ENCOUNTER — Other Ambulatory Visit: Payer: Self-pay | Admitting: Obstetrics & Gynecology

## 2013-12-20 ENCOUNTER — Telehealth: Payer: Self-pay | Admitting: Family Medicine

## 2013-12-20 MED ORDER — SUMATRIPTAN SUCCINATE 100 MG PO TABS: ORAL_TABLET | ORAL | Status: DC

## 2013-12-20 MED ORDER — HYDROCODONE-ACETAMINOPHEN 5-325 MG PO TABS: 1.0000 | ORAL_TABLET | ORAL | Status: DC | PRN

## 2013-12-20 NOTE — Telephone Encounter (Signed)
Pt request refill of the following: HYDROcodone-acetaminophen (NORCO/VICODIN) 5-325 MG per tablet,SUMAtriptan (IMITREX) 100 MG tablet     Phamacy:

## 2013-12-20 NOTE — Telephone Encounter (Signed)
Scripts are ready for pick up and I left a voice message for pt. 

## 2013-12-20 NOTE — Telephone Encounter (Signed)
done

## 2014-03-06 ENCOUNTER — Other Ambulatory Visit: Payer: Self-pay | Admitting: Family Medicine

## 2014-03-07 MED ORDER — ALPRAZOLAM 0.5 MG PO TABS: ORAL_TABLET | ORAL | Status: DC

## 2014-03-07 NOTE — Telephone Encounter (Signed)
Call in #60 Xanax with no refills. No refills on pain med without an OV. She needs to make an OV soon.

## 2014-03-10 NOTE — Telephone Encounter (Signed)
Pt has appt tomorrow

## 2014-03-11 ENCOUNTER — Encounter: Payer: Self-pay | Admitting: Family Medicine

## 2014-03-11 ENCOUNTER — Ambulatory Visit (INDEPENDENT_AMBULATORY_CARE_PROVIDER_SITE_OTHER): Payer: 59 | Admitting: Family Medicine

## 2014-03-11 VITALS — BP 97/65 | HR 86 | Temp 98.6°F | Ht 65.25 in | Wt 127.0 lb

## 2014-03-11 DIAGNOSIS — J209 Acute bronchitis, unspecified: Secondary | ICD-10-CM

## 2014-03-11 MED ORDER — AMOXICILLIN-POT CLAVULANATE 875-125 MG PO TABS: 1.0000 | ORAL_TABLET | Freq: Two times a day (BID) | ORAL | Status: DC

## 2014-03-11 MED ORDER — HYDROCODONE-ACETAMINOPHEN 5-325 MG PO TABS: 1.0000 | ORAL_TABLET | ORAL | Status: DC | PRN

## 2014-03-11 NOTE — Progress Notes (Signed)
Pre visit review using our clinic review tool, if applicable. No additional management support is needed unless otherwise documented below in the visit note. 

## 2014-03-11 NOTE — Progress Notes (Signed)
   Subjective:    Patient ID: Sarah FlockDonna G Lawson, female    DOB: 06-26-1965, 48 y.o.   MRN: [id number]  HPI Here for 10 days of chest tightness and coughing up yellow sputum. On Nyquil.    Review of Systems  Constitutional: Negative.   HENT: Positive for congestion. Negative for postnasal drip and sinus pressure.   Eyes: Negative.   Respiratory: Positive for cough.        Objective:   Physical Exam  Constitutional: She appears well-developed and well-nourished.  HENT:  Right Ear: External ear normal.  Left Ear: External ear normal.  Nose: Nose normal.  Mouth/Throat: Oropharynx is clear and moist.  Eyes: Conjunctivae are normal.  Pulmonary/Chest: Effort normal. No respiratory distress. She has no wheezes. She has no rales.  Scattered rhonchi  Lymphadenopathy:    She has no cervical adenopathy.          Assessment & Plan:  Add Mucinex

## 2014-03-12 ENCOUNTER — Telehealth: Payer: Self-pay | Admitting: Family Medicine

## 2014-03-12 NOTE — Telephone Encounter (Signed)
emmi mailed  °

## 2014-04-17 ENCOUNTER — Other Ambulatory Visit: Payer: Self-pay | Admitting: Family Medicine

## 2014-05-31 ENCOUNTER — Other Ambulatory Visit: Payer: Self-pay | Admitting: Family Medicine

## 2014-06-02 NOTE — Telephone Encounter (Signed)
Call in #60 with 5 rf 

## 2014-07-04 ENCOUNTER — Telehealth: Payer: Self-pay | Admitting: Family Medicine

## 2014-07-04 NOTE — Telephone Encounter (Signed)
[facility] - Client TELEPHONE ADVICE RECORD [facility]  Patient Name: Sarah Gay  DOB: Jan 23, 1966    Initial Comment Caller states is prone to strep throat. Needs some antibiotics.    Nurse Assessment  Nurse: Laural BenesJohnson, RN, Dondra SpryGail Date/Time Lamount Cohen(Eastern Time): 07/04/2014 4:34:07 PM  Confirm and document reason for call. If symptomatic, describe symptoms. ---Sarah Gay is complaining of sore throat -- prone to strep on regular basis -- daughter is diagnosed with strep. onset yesterday  Has the patient traveled out of the country within the last 30 days? ---No  Does the patient require triage? ---Yes  Related visit to physician within the last 2 weeks? ---No  Does the PT have any chronic conditions? (i.e. diabetes, asthma, etc.) ---Unknown  Did the patient indicate they were pregnant? ---No     Guidelines    Guideline Title Affirmed Question Affirmed Notes  Sore Throat SEVERE (e.g., excruciating) throat pain    Final Disposition User   See Physician within 24 Hours Laural BenesJohnson, RN, Dondra SpryGail    Comments  Sarah Gay wanted antibiotics called in -- Medical Profile states "Oral Antibiotics will not be called in -- patient must be seen" Nurse advised her of this and states this is what the MD's have decided Apologized and advised she can go to Urgent Care; States she will wait until Dr. Claris CheFry's computer is back up and running on Monday and she disconnected line.

## 2014-07-07 NOTE — Telephone Encounter (Signed)
I left a voice message advising pt to schedule with Dr. Clent RidgesFry today, we still have a few openings this afternoon.

## 2014-07-08 ENCOUNTER — Ambulatory Visit (INDEPENDENT_AMBULATORY_CARE_PROVIDER_SITE_OTHER): Payer: 59 | Admitting: Family

## 2014-07-08 ENCOUNTER — Encounter: Payer: Self-pay | Admitting: Family

## 2014-07-08 VITALS — BP 100/68 | HR 82 | Temp 98.3°F | Resp 18 | Wt 123.4 lb

## 2014-07-08 DIAGNOSIS — J029 Acute pharyngitis, unspecified: Secondary | ICD-10-CM | POA: Diagnosis not present

## 2014-07-08 MED ORDER — CEFUROXIME AXETIL 250 MG PO TABS: 250.0000 mg | ORAL_TABLET | Freq: Two times a day (BID) | ORAL | Status: DC

## 2014-07-08 NOTE — Assessment & Plan Note (Signed)
In office strep test is negative. Cannot rule out viral pharyngitis, however given Orajel irritation start Ceftin. Continue over-the-counter medications as needed for symptom relief. Follow-up if symptoms worsen or fail to improve

## 2014-07-08 NOTE — Patient Instructions (Addendum)
Thank you for choosing  HealthCare.  Summary/Instructions:  Your prescription(s) have been submitted to your pharmacy or been printed and provided for you. Please take as directed and contact our office if you believe you are having problem(s) with the medication(s) or have any questions.  If your symptoms worsen or fail to improve, please contact our office for further instruction, or in case of emergency go directly to the emergency room at the closest medical facility.     

## 2014-07-08 NOTE — Progress Notes (Signed)
   Subjective:    Patient ID: Sarah Gay, female    DOB: [birth year], 49 y.o.   MRN: [id number]  Chief Complaint  Patient presents with  . Sore Throat    Sore throat, no energy, body aches, and chills, x5 days    HPI:  Sarah Gay is a 49 y.o. female who presents today for an acute visit.  This is a new problem. Associated symptoms of sore throat, fatigue/lack of energy, body aches and chills has been going on for 5 days. Has tried orajel mouth wash to try and numb the pain.   Allergies  Allergen Reactions  . Sulfonamide Derivatives    p Current Outpatient Prescriptions on File Prior to Visit  Medication Sig Dispense Refill  . ALPRAZolam (XANAX) 0.5 MG tablet take 1 tablet by mouth three times a day if needed 60 tablet 5  . FLUoxetine (PROZAC) 40 MG capsule Take 1 capsule (40 mg total) by mouth 2 (two) times daily. 180 capsule 3  . HYDROcodone-acetaminophen (NORCO/VICODIN) 5-325 MG per tablet Take 1 tablet by mouth every 4 (four) hours as needed. 120 tablet 0  . Melatonin 5 MG TABS Take 1 each by mouth at bedtime.      Marland Kitchen. omeprazole (PRILOSEC) 40 MG capsule Take 1 capsule by mouth  daily 90 capsule 0  . SUMAtriptan (IMITREX) 100 MG tablet take 1 tablet by mouth if needed for migraines as directed 9 tablet 11  . zolpidem (AMBIEN CR) 12.5 MG CR tablet Take 1 tablet (12.5 mg total) by mouth at bedtime as needed for sleep. 30 tablet 5   No current facility-administered medications on file prior to visit.    Review of Systems  Constitutional: Positive for chills and fatigue.  HENT: Positive for congestion and sore throat. Negative for sinus pressure.   Respiratory: Negative for chest tightness and shortness of breath.   Neurological: Negative for headaches.      Objective:    BP 100/68 mmHg  Pulse 82  Temp(Src) 98.3 F (36.8 C) (Oral)  Resp 18  Wt 123 lb 6.4 oz (55.974 kg)  SpO2 97% Nursing note and vital signs reviewed.  Physical Exam  Constitutional: She is  oriented to person, place, and time. She appears well-developed and well-nourished. No distress.  Cardiovascular: Normal rate, regular rhythm, normal heart sounds and intact distal pulses.   Pulmonary/Chest: Effort normal and breath sounds normal.  Neurological: She is alert and oriented to person, place, and time.  Skin: Skin is warm and dry.  Psychiatric: She has a normal mood and affect. Her behavior is normal. Judgment and thought content normal.       Assessment & Plan:

## 2014-07-08 NOTE — Progress Notes (Signed)
Pre visit review using our clinic review tool, if applicable. No additional management support is needed unless otherwise documented below in the visit note. 

## 2014-08-05 ENCOUNTER — Telehealth: Payer: Self-pay | Admitting: Family Medicine

## 2014-08-05 MED ORDER — HYDROCODONE-ACETAMINOPHEN 5-325 MG PO TABS: 1.0000 | ORAL_TABLET | ORAL | Status: DC | PRN

## 2014-08-05 NOTE — Telephone Encounter (Signed)
Vicodin is done. We already refilled the Xanax in February

## 2014-08-05 NOTE — Telephone Encounter (Signed)
Patient is requesting re-fills on HYDROcodone-acetaminophen (NORCO/VICODIN) 5-325 MG per tablet and have ALPRAZolam (XANAX) 0.5 MG tablet sent to [id number] - [place], [place] - [address].

## 2014-08-06 NOTE — Telephone Encounter (Signed)
Called and left a message for pt that rx is ready for pick.

## 2014-10-06 ENCOUNTER — Telehealth: Payer: Self-pay | Admitting: Family Medicine

## 2014-10-06 ENCOUNTER — Ambulatory Visit (INDEPENDENT_AMBULATORY_CARE_PROVIDER_SITE_OTHER): Payer: 59 | Admitting: Family Medicine

## 2014-10-06 ENCOUNTER — Encounter: Payer: Self-pay | Admitting: Family Medicine

## 2014-10-06 VITALS — BP 90/60 | HR 105 | Temp 99.1°F | Ht 65.25 in | Wt 122.8 lb

## 2014-10-06 DIAGNOSIS — H109 Unspecified conjunctivitis: Secondary | ICD-10-CM

## 2014-10-06 MED ORDER — ERYTHROMYCIN 5 MG/GM OP OINT: 1.0000 "application " | TOPICAL_OINTMENT | Freq: Every day | OPHTHALMIC | Status: DC

## 2014-10-06 NOTE — Telephone Encounter (Signed)
[facility] - Client TELEPHONE ADVICE RECORD [facility] Patient Name: Sarah Gay Gender: Female DOB: 12-22-1965 Age: 49 Y 3 M 9 D Return Phone Number: [phone] (Primary) Address: [address] City/State/Zip: [place] [address] Client [facility] - Client Client Site [facility] - Day Physician Gershon Crane Contact Type Call Call Type Triage / Clinical Relationship To Patient Self Appointment Disposition EMR Appointment Not Necessary Info pasted into Epic Yes Return Phone Number [phone] (Primary) Chief Complaint Eye Redness Initial Comment Caller states having symptoms of pink eye, asking if she can get something called in PreDisposition Call Doctor Nurse Assessment Nurse: Roderic Ovens, RN, Amy Date/Time Lamount Cohen Time): 10/06/2014 10:33:19 AM Confirm and document reason for call. If symptomatic, describe symptoms. ---CALLER STATES THAT SHE WAS AROUND THE PINK EYE. SHE IS HAVING IRRITATION OF LEFT EYE, ITCHY, RED, SOME WEEPING. NO COUGH OR CONGESTION. NO FEVER. SHE STATES THAT SHE NOTICED HER EYE LIKE THIS WHEN SHE WOKE UP THIS MORNING. Has the patient traveled out of the country within the last 30 days? ---Not Applicable Does the patient require triage? ---Yes Related visit to physician within the last 2 weeks? ---No Does the PT have any chronic conditions? (i.e. diabetes, asthma, etc.) ---No Did the patient indicate they were pregnant? ---No Guidelines Guideline Title Affirmed Question Affirmed Notes Nurse Date/Time (Eastern Time) Eye - Pus or Discharge Eyelid is red and painful (or tender to touch) Roderic Ovens, RN, Amy 10/06/2014 10:34:31 AM Disp. Time Lamount Cohen Time) Disposition Final User 10/06/2014 10:35:57 AM See Physician within 24 Hours Yes Bowdle, RN, Amy Caller Understands: Yes Disagree/Comply: Comply PLEASE  Comments User: Brent Bulla, RN Date/Time (Eastern  Time): 10/06/2014 10:38:54 AM CALLER STATES THAT SHE WANTS SOMETHING CALLED IN FOR HER EYE. SHE STATES THAT IF THE MESSAGE GETS TO DR. Clent Ridges THAT HE WILL CALL HER SOMETHING IN, HE ALWAYS DOES. SHE WILL CALL BACK IF SHE DOES NOT HEAR FROM HIM AND WILL THEN GET AN APPT. Referrals REFERRED TO PCP OFFICE

## 2014-10-06 NOTE — Progress Notes (Signed)
Pre visit review using our clinic review tool, if applicable. No additional management support is needed unless otherwise documented below in the visit note. 

## 2014-10-06 NOTE — Patient Instructions (Signed)

## 2014-10-06 NOTE — Progress Notes (Signed)
b  HPI:  "Pink eye" -started today -L itchy irritated eye, clear drainage -around someone with pick eye recently -denies: vision changes, pain, fevers, severe headache  ROS: See pertinent positives and negatives per HPI.  Past Medical History  Diagnosis Date  . Mononucleosis     as a child   . Asthma   . Depression   . Migraines     Past Surgical History  Procedure Laterality Date  . Spine surgery      repair C7 cervical disc   . Breast surgery      bilateral augmentations    Family History  Problem Relation Age of Onset  . Breast cancer Mother   . Heart disease Father     History   Social History  . Marital Status: Married    Spouse Name: N/A  . Number of Children: N/A  . Years of Education: N/A   Social History Main Topics  . Smoking status: Current Some Day Smoker    Types: Cigarettes  . Smokeless tobacco: Never Used  . Alcohol Use: No  . Drug Use: No  . Sexual Activity: Not on file   Other Topics Concern  . None   Social History Narrative     Current outpatient prescriptions:  .  ALPRAZolam (XANAX) 0.5 MG tablet, take 1 tablet by mouth three times a day if needed, Disp: 60 tablet, Rfl: 5 .  FLUoxetine (PROZAC) 40 MG capsule, Take 1 capsule (40 mg total) by mouth 2 (two) times daily., Disp: 180 capsule, Rfl: 3 .  HYDROcodone-acetaminophen (NORCO/VICODIN) 5-325 MG per tablet, Take 1 tablet by mouth every 4 (four) hours as needed., Disp: 120 tablet, Rfl: 0 .  Melatonin 5 MG TABS, Take 1 each by mouth at bedtime.  , Disp: , Rfl:  .  omeprazole (PRILOSEC) 40 MG capsule, Take 1 capsule by mouth  daily, Disp: 90 capsule, Rfl: 0 .  SUMAtriptan (IMITREX) 100 MG tablet, take 1 tablet by mouth if needed for migraines as directed, Disp: 9 tablet, Rfl: 11 .  zolpidem (AMBIEN CR) 12.5 MG CR tablet, Take 1 tablet (12.5 mg total) by mouth at bedtime as needed for sleep., Disp: 30 tablet, Rfl: 5 .  erythromycin ophthalmic ointment, Place 1 application into the  left eye at bedtime., Disp: 3.5 g, Rfl: 0  EXAM:  Filed Vitals:   10/06/14 1612  BP: 90/60  Pulse: 105  Temp: 99.1 F (37.3 C)    Body mass index is 20.29 kg/(m^2).  GENERAL: vitals reviewed and listed above, alert, oriented, appears well hydrated and in no acute distress  HEENT: atraumatic, conjuntival erythema on L with clear drainage, visual acuity grossly intact, PERRLA, no obvious abnormalities on inspection of external nose and ears  NECK: no obvious masses on inspection  LUNGS: clear to auscultation bilaterally, no wheezes, rales or rhonchi, good air movement  CV: HRRR, no peripheral edema  MS: moves all extremities without noticeable abnormality  PSYCH: pleasant and cooperative, no obvious depression or anxiety  ASSESSMENT AND PLAN:  Discussed the following assessment and plan:  Conjunctivitis of left eye - Plan: erythromycin ophthalmic ointment  -likely viral and advised compresses, she is very worried about a bacterial etiology as friend whom she got it from is taking and antibiotic -abx ointment given in case symptoms persist or do not respond to saline and compresses -Patient advised to return or notify a doctor immediately if symptoms worsen or persist or new concerns arise.  Patient Instructions  Conjunctivitis Conjunctivitis  is commonly called "pink eye." Conjunctivitis can be caused by bacterial or viral infection, allergies, or injuries. There is usually redness of the lining of the eye, itching, discomfort, and sometimes discharge. There may be deposits of matter along the eyelids. A viral infection usually causes a watery discharge, while a bacterial infection causes a yellowish, thick discharge. Pink eye is very contagious and spreads by direct contact. You may be given antibiotic eyedrops as part of your treatment. Before using your eye medicine, remove all drainage from the eye by washing gently with warm water and cotton balls. Continue to use the  medication until you have awakened 2 mornings in a row without discharge from the eye. Do not rub your eye. This increases the irritation and helps spread infection. Use separate towels from other household members. Wash your hands with soap and water before and after touching your eyes. Use cold compresses to reduce pain and sunglasses to relieve irritation from light. Do not wear contact lenses or wear eye makeup until the infection is gone. SEEK MEDICAL CARE IF:   Your symptoms are not better after 3 days of treatment.  You have increased pain or trouble seeing.  The outer eyelids become very red or swollen. Document Released: 05/12/2004 Document Revised: 06/27/2011 Document Reviewed: 04/04/2005 [facility] Patient Information 2015 [place], [place]. This information is not intended to replace advice given to you by your health care provider. Make sure you discuss any questions you have with your health care provider.      Kriste Basque R.

## 2014-10-06 NOTE — Telephone Encounter (Signed)
Please advise 

## 2014-10-08 NOTE — Telephone Encounter (Signed)
She was seen OV that day

## 2014-11-07 ENCOUNTER — Other Ambulatory Visit: Payer: Self-pay | Admitting: Family Medicine

## 2014-11-07 NOTE — Telephone Encounter (Signed)
done

## 2014-11-07 NOTE — Telephone Encounter (Signed)
Script is ready for pick up and I spoke with pt.  

## 2014-12-24 ENCOUNTER — Other Ambulatory Visit: Payer: Self-pay | Admitting: Family Medicine

## 2014-12-25 NOTE — Telephone Encounter (Signed)
Call in Sumatriptan #9 with 11 rf, also Xanax #60 with 5 rf

## 2015-01-01 ENCOUNTER — Other Ambulatory Visit: Payer: Self-pay | Admitting: Family Medicine

## 2015-01-02 ENCOUNTER — Telehealth: Payer: Self-pay | Admitting: Family Medicine

## 2015-01-02 NOTE — Telephone Encounter (Signed)
Scripts were called in today 01/02/15.

## 2015-01-02 NOTE — Telephone Encounter (Signed)
Refill Xanax for 6 months and refill Sumatriptan for 12 months

## 2015-01-02 NOTE — Telephone Encounter (Signed)
Pt following up on rx request for  ALPRAZolam Prudy Feeler) 0.5 MG tablet SUMAtriptan (IMITREX) 100 MG tablet  Dr Clent Ridges approved on 9/07 but was not sent in. Pt does not have any imitrex in case of a migraine and is afraid she might  Can we send to [facility]

## 2015-01-05 ENCOUNTER — Other Ambulatory Visit: Payer: Self-pay | Admitting: Family Medicine

## 2015-01-27 ENCOUNTER — Telehealth: Payer: Self-pay | Admitting: Family Medicine

## 2015-01-27 NOTE — Telephone Encounter (Signed)
Script is ready for pick up with lab orders on it and I left a voice message for pt.

## 2015-01-27 NOTE — Telephone Encounter (Signed)
Orders were written and in your box

## 2015-01-27 NOTE — Telephone Encounter (Signed)
Pt works at [employer] and would like an order to have the blood work done there prior to the physical scheduled for 03/04/15. Please contact the Pt for more details. If you'd like to email it.... [email or web address]

## 2015-01-28 ENCOUNTER — Other Ambulatory Visit: Payer: Self-pay | Admitting: Family Medicine

## 2015-01-30 ENCOUNTER — Encounter: Payer: 59 | Admitting: Family Medicine

## 2015-02-02 NOTE — Telephone Encounter (Signed)
done

## 2015-02-02 NOTE — Telephone Encounter (Signed)
Script is ready for pick up and I spoke with pt.  

## 2015-03-04 ENCOUNTER — Ambulatory Visit (INDEPENDENT_AMBULATORY_CARE_PROVIDER_SITE_OTHER): Payer: 59 | Admitting: Family Medicine

## 2015-03-04 ENCOUNTER — Encounter: Payer: Self-pay | Admitting: Family Medicine

## 2015-03-04 VITALS — BP 98/60 | HR 84 | Temp 98.7°F | Ht 65.25 in | Wt 127.0 lb

## 2015-03-04 DIAGNOSIS — Z0000 Encounter for general adult medical examination without abnormal findings: Secondary | ICD-10-CM

## 2015-03-04 NOTE — Progress Notes (Signed)
   Subjective:    Patient ID: Sarah Gay, female    DOB: [birth year], 49 y.o.   MRN: [id number]  HPI 49 yr old female for a cpx. She feels well. She averages 2-3 migraines a month, and Imitrex always does a good job with these. She saw her GYN last week, and they have already referred her for a colonoscopy,    Review of Systems  Constitutional: Negative.   HENT: Negative.   Eyes: Negative.   Respiratory: Negative.   Cardiovascular: Negative.   Gastrointestinal: Negative.   Genitourinary: Negative for dysuria, urgency, frequency, hematuria, flank pain, decreased urine volume, enuresis, difficulty urinating, pelvic pain and dyspareunia.  Musculoskeletal: Negative.   Skin: Negative.   Neurological: Negative.   Psychiatric/Behavioral: Negative.        Objective:   Physical Exam  Constitutional: She is oriented to person, place, and time. She appears well-developed and well-nourished. No distress.  HENT:  Head: Normocephalic and atraumatic.  Right Ear: External ear normal.  Left Ear: External ear normal.  Nose: Nose normal.  Mouth/Throat: Oropharynx is clear and moist. No oropharyngeal exudate.  Eyes: Conjunctivae and EOM are normal. Pupils are equal, round, and reactive to light. No scleral icterus.  Neck: Normal range of motion. Neck supple. No JVD present. No thyromegaly present.  Cardiovascular: Normal rate, regular rhythm, normal heart sounds and intact distal pulses.  Exam reveals no gallop and no friction rub.   No murmur heard. Pulmonary/Chest: Effort normal and breath sounds normal. No respiratory distress. She has no wheezes. She has no rales. She exhibits no tenderness.  Abdominal: Soft. Bowel sounds are normal. She exhibits no distension and no mass. There is no tenderness. There is no rebound and no guarding.  Musculoskeletal: Normal range of motion. She exhibits no edema or tenderness.  Lymphadenopathy:    She has no cervical adenopathy.  Neurological: She is alert  and oriented to person, place, and time. She has normal reflexes. No cranial nerve deficit. She exhibits normal muscle tone. Coordination normal.  Skin: Skin is warm and dry. No rash noted. No erythema.  Psychiatric: She has a normal mood and affect. Her behavior is normal. Judgment and thought content normal.          Assessment & Plan:  Well exam. We discussed diet and exercise advice.

## 2015-03-04 NOTE — Progress Notes (Signed)
Pre visit review using our clinic review tool, if applicable. No additional management support is needed unless otherwise documented below in the visit note. 

## 2015-04-03 ENCOUNTER — Ambulatory Visit (INDEPENDENT_AMBULATORY_CARE_PROVIDER_SITE_OTHER): Payer: 59 | Admitting: Family Medicine

## 2015-04-03 ENCOUNTER — Encounter: Payer: Self-pay | Admitting: Family Medicine

## 2015-04-03 VITALS — BP 99/65 | HR 94 | Temp 98.5°F | Resp 20 | Wt 127.2 lb

## 2015-04-03 DIAGNOSIS — J0100 Acute maxillary sinusitis, unspecified: Secondary | ICD-10-CM

## 2015-04-03 MED ORDER — AZITHROMYCIN 250 MG PO TABS: ORAL_TABLET | ORAL | Status: DC

## 2015-04-03 NOTE — Patient Instructions (Signed)
Sinusitis, Adult Sinusitis is redness, soreness, and inflammation of the paranasal sinuses. Paranasal sinuses are air pockets within the bones of your face. They are located beneath your eyes, in the middle of your forehead, and above your eyes. In healthy paranasal sinuses, mucus is able to drain out, and air is able to circulate through them by way of your nose. However, when your paranasal sinuses are inflamed, mucus and air can become trapped. This can allow bacteria and other germs to grow and cause infection. Sinusitis can develop quickly and last only a short time (acute) or continue over a long period (chronic). Sinusitis that lasts for more than 12 weeks is considered chronic. CAUSES Causes of sinusitis include:  Allergies.  Structural abnormalities, such as displacement of the cartilage that separates your nostrils (deviated septum), which can decrease the air flow through your nose and sinuses and affect sinus drainage.  Functional abnormalities, such as when the small hairs (cilia) that line your sinuses and help remove mucus do not work properly or are not present. SIGNS AND SYMPTOMS Symptoms of acute and chronic sinusitis are the same. The primary symptoms are pain and pressure around the affected sinuses. Other symptoms include:  Upper toothache.  Earache.  Headache.  Bad breath.  Decreased sense of smell and taste.  A cough, which worsens when you are lying flat.  Fatigue.  Fever.  Thick drainage from your nose, which often is green and may contain pus (purulent).  Swelling and warmth over the affected sinuses. DIAGNOSIS Your health care provider will perform a physical exam. During your exam, your health care provider may perform any of the following to help determine if you have acute sinusitis or chronic sinusitis:  Look in your nose for signs of abnormal growths in your nostrils (nasal polyps).  Tap over the affected sinus to check for signs of  infection.  View the inside of your sinuses using an imaging device that has a light attached (endoscope). If your health care provider suspects that you have chronic sinusitis, one or more of the following tests may be recommended:  Allergy tests.  Nasal culture. A sample of mucus is taken from your nose, sent to a lab, and screened for bacteria.  Nasal cytology. A sample of mucus is taken from your nose and examined by your health care provider to determine if your sinusitis is related to an allergy. TREATMENT Most cases of acute sinusitis are related to a viral infection and will resolve on their own within 10 days. Sometimes, medicines are prescribed to help relieve symptoms of both acute and chronic sinusitis. These may include pain medicines, decongestants, nasal steroid sprays, or saline sprays. However, for sinusitis related to a bacterial infection, your health care provider will prescribe antibiotic medicines. These are medicines that will help kill the bacteria causing the infection. Rarely, sinusitis is caused by a fungal infection. In these cases, your health care provider will prescribe antifungal medicine. For some cases of chronic sinusitis, surgery is needed. Generally, these are cases in which sinusitis recurs more than 3 times per year, despite other treatments. HOME CARE INSTRUCTIONS  Drink plenty of water. Water helps thin the mucus so your sinuses can drain more easily.  Use a humidifier.  Inhale steam 3-4 times a day (for example, sit in the bathroom with the shower running).  Apply a warm, moist washcloth to your face 3-4 times a day, or as directed by your health care provider.  Use saline nasal sprays to help   moisten and clean your sinuses.  Take medicines only as directed by your health care provider.  If you were prescribed either an antibiotic or antifungal medicine, finish it all even if you start to feel better. SEEK IMMEDIATE MEDICAL CARE IF:  You have  increasing pain or severe headaches.  You have nausea, vomiting, or drowsiness.  You have swelling around your face.  You have vision problems.  You have a stiff neck.  You have difficulty breathing.   This information is not intended to replace advice given to you by your health care provider. Make sure you discuss any questions you have with your health care provider.   Document Released: 04/04/2005 Document Revised: 04/25/2014 Document Reviewed: 04/19/2011 Elsevier Interactive Patient Education 2016 [employer]   Pick flonase and use for 2-4 weeks until symptoms reslove. It is safe to use daily for allergies.

## 2015-04-03 NOTE — Progress Notes (Signed)
   Subjective:    Patient ID: Sarah FlockDonna G Wunder, female    DOB: 04/21/1965, 49 y.o.   MRN: [id number]  HPI  Bilateral ear pain: Patient presents with over a month history of bilateral ear pain. She states she was seen by her PCP proximally one month ago but forgot to mention it to him. Since then she has been intermittently using rubbing alcohol, capful forward into her ear, with a cottonball overnight. She states she feels like "water sloshing around "in her ear. She reports her right ear, is worse than her left ear. She states even walking she can feel the water vibrating in her ear. She states she had to be seen today because it is driving her crazy. She denies any ear pain, chills or fever. She denies having allergies. She is a current every day smoker. She does have an asthma history.   Past Medical History  Diagnosis Date  . Mononucleosis     as a child   . Asthma   . Depression   . Migraines    Allergies  Allergen Reactions  . Sulfonamide Derivatives    Past Surgical History  Procedure Laterality Date  . Spine surgery      repair C7 cervical disc   . Breast surgery      bilateral augmentations   Social History   Social History  . Marital Status: Married    Spouse Name: N/A  . Number of Children: N/A  . Years of Education: N/A   Occupational History  . Not on file.   Social History Main Topics  . Smoking status: Current Some Day Smoker    Types: Cigarettes  . Smokeless tobacco: Never Used  . Alcohol Use: 0.0 oz/week    0 Standard drinks or equivalent per week  . Drug Use: No  . Sexual Activity: Not on file   Other Topics Concern  . Not on file   Social History Narrative    Review of Systems Negative, with the exception of above mentioned in HPI     Objective:   Physical Exam BP 99/65 mmHg  Pulse 94  Temp(Src) 98.5 F (36.9 C)  Resp 20  Wt 127 lb 4 oz (57.72 kg)  SpO2 96% Gen: Afebrile. No acute distress. No acute distress, nontoxic in appearance,  well-developed, well-nourished, Caucasian female. HENT: AT. [place]. Bilateral TM visualized , air fluid levels bilaterally R>L. No erythema or bulging membranes.. MMM, no oral lesions. Bilateral nares erythema with swelling. Throat without erythema or exudates. Tender to palpation facial sinuses. Mild cough on exam, mild hoarseness on exam. Eyes:Pupils Equal Round Reactive to light, Extraocular movements intact,  Conjunctiva without redness, discharge or icterus. Neck/lymp/endocrine: Supple, mild anterior cervical lymphadenopathy CV: RRR Chest: CTAB, no wheeze or crackles Skin: No rashes, purpura or petechiae.      Assessment & Plan:  1. Acute maxillary sinusitis, recurrence not specified - Rest, hydrate, Flonase use. Strongly encouraged patient to use either Flonase or antihistamine daily. Flonase was more strongly encouraged secondary to eyes and ear symptoms. Patient seemed hesitant to use nasal spray, proper nasal spray use as discussed with patient. AVS on sinusitis was provided to patient. - azithromycin (ZITHROMAX Z-PAK) 250 MG tablet; 500 mg QD, 250 mg QD  Dispense: 6 each; Refill: 0

## 2015-04-24 ENCOUNTER — Ambulatory Visit (INDEPENDENT_AMBULATORY_CARE_PROVIDER_SITE_OTHER): Payer: 59 | Admitting: Family Medicine

## 2015-04-24 ENCOUNTER — Encounter: Payer: Self-pay | Admitting: Family Medicine

## 2015-04-24 VITALS — BP 96/67 | HR 87 | Temp 98.3°F | Ht 65.25 in | Wt 124.0 lb

## 2015-04-24 DIAGNOSIS — J019 Acute sinusitis, unspecified: Secondary | ICD-10-CM | POA: Diagnosis not present

## 2015-04-24 MED ORDER — HYDROCODONE-ACETAMINOPHEN 5-325 MG PO TABS: ORAL_TABLET | ORAL | Status: DC

## 2015-04-24 MED ORDER — METHYLPREDNISOLONE 4 MG PO TBPK: ORAL_TABLET | ORAL | Status: DC

## 2015-04-24 MED ORDER — AMOXICILLIN-POT CLAVULANATE 875-125 MG PO TABS: 1.0000 | ORAL_TABLET | Freq: Two times a day (BID) | ORAL | Status: DC

## 2015-04-24 NOTE — Progress Notes (Signed)
Pre visit review using our clinic review tool, if applicable. No additional management support is needed unless otherwise documented below in the visit note. 

## 2015-04-24 NOTE — Progress Notes (Signed)
   Subjective:    Patient ID: Sarah Gay, female    DOB: [birth year], 50 y.o.   MRN: [id number]  HPI Here for several months of sinus pressure, ear pressure or pain, PND, and ST. No fever or cough. Using Flonase. She saw seen in December and was given a Zpack, but this did not help much.    Review of Systems  Constitutional: Negative.   HENT: Positive for congestion, ear pain, postnasal drip, sinus pressure and sore throat.   Eyes: Negative.   Respiratory: Negative.        Objective:   Physical Exam  Constitutional: She appears well-developed and well-nourished.  HENT:  Right Ear: External ear normal.  Left Ear: External ear normal.  Nose: Nose normal.  Mouth/Throat: Oropharynx is clear and moist.  Eyes: Conjunctivae are normal.  Neck: No thyromegaly present.  Pulmonary/Chest: Effort normal and breath sounds normal.  Lymphadenopathy:    She has no cervical adenopathy.          Assessment & Plan:  Sinusitis, treat with Augmentin and a Medrol dose pack.

## 2015-06-02 ENCOUNTER — Telehealth: Payer: Self-pay | Admitting: Family Medicine

## 2015-06-02 NOTE — Telephone Encounter (Signed)
Patient needs an order for a CBC, [facility] BLOT lab work because she works at [employer] and they will do the labs for free.

## 2015-06-02 NOTE — Telephone Encounter (Signed)
She just had complete lab work in November with her cpx. Why does she want to get more now? Also what is the [facility] blot looking for?

## 2015-06-03 NOTE — Telephone Encounter (Signed)
I spoke with pt, she wants to be tested for Lyme disease and the white blood cell count, looking for any signs of infection? If okay needs script faxed to [phone] attention Maly Lemarr.

## 2015-06-03 NOTE — Telephone Encounter (Signed)
The orders are ready to fax  

## 2015-06-04 LAB — CBC AND DIFFERENTIAL
HCT: 42 % (ref 36–46)
HEMOGLOBIN: 14.2 g/dL (ref 12.0–16.0)
Platelets: 270 10*3/uL (ref 150–399)
WBC: 7.6 10*3/mL

## 2015-06-04 NOTE — Telephone Encounter (Signed)
I faxed script to below number and spoke with pt.  

## 2015-06-05 ENCOUNTER — Encounter: Payer: Self-pay | Admitting: Family Medicine

## 2015-06-23 ENCOUNTER — Encounter: Payer: Self-pay | Admitting: Family Medicine

## 2015-08-04 ENCOUNTER — Other Ambulatory Visit: Payer: Self-pay

## 2015-08-04 NOTE — Telephone Encounter (Signed)
Call in #60 with 5 rf 

## 2015-08-05 MED ORDER — ALPRAZOLAM 0.5 MG PO TABS: ORAL_TABLET | ORAL | Status: DC

## 2015-08-27 ENCOUNTER — Telehealth: Payer: Self-pay | Admitting: Family Medicine

## 2015-08-27 NOTE — Telephone Encounter (Signed)
Pt has an appt tomorrow for tdap  and needs new rx hydrocodone

## 2015-08-28 ENCOUNTER — Ambulatory Visit (INDEPENDENT_AMBULATORY_CARE_PROVIDER_SITE_OTHER): Payer: 59 | Admitting: Family Medicine

## 2015-08-28 DIAGNOSIS — Z23 Encounter for immunization: Secondary | ICD-10-CM

## 2015-08-28 MED ORDER — HYDROCODONE-ACETAMINOPHEN 5-325 MG PO TABS: ORAL_TABLET | ORAL | Status: DC

## 2015-08-28 NOTE — Telephone Encounter (Signed)
Pt will pick up today after injection.

## 2015-08-28 NOTE — Telephone Encounter (Signed)
done

## 2015-09-01 ENCOUNTER — Other Ambulatory Visit: Payer: Self-pay | Admitting: *Deleted

## 2015-09-15 ENCOUNTER — Other Ambulatory Visit: Payer: Self-pay | Admitting: *Deleted

## 2015-09-15 NOTE — Telephone Encounter (Signed)
[facility]. I spoke with pt, she will change pharmacies next time, [facility]

## 2015-09-15 NOTE — Telephone Encounter (Signed)
Pt is out of alprazolam

## 2015-09-15 NOTE — Telephone Encounter (Signed)
Pt need Rx for Xanax and pt state it has been 5 wks

## 2015-09-15 NOTE — Telephone Encounter (Signed)
We called in #60 with 5 rf on 08-05-15

## 2015-09-15 NOTE — Telephone Encounter (Signed)
[facility]

## 2015-12-10 ENCOUNTER — Telehealth: Payer: Self-pay | Admitting: Family Medicine

## 2015-12-10 NOTE — Telephone Encounter (Signed)
Pt would like to have her lab drawn by her job ([facility]) for her CPE that is scheduled for 01/14/16.  Can orders be faxed to [facility] at [phone]?

## 2015-12-15 NOTE — Telephone Encounter (Signed)
Ready to fax  

## 2015-12-15 NOTE — Telephone Encounter (Signed)
I faxed script for labs to below number and left a voice message with this information.

## 2015-12-23 ENCOUNTER — Encounter: Payer: Self-pay | Admitting: Family Medicine

## 2015-12-25 ENCOUNTER — Telehealth: Payer: Self-pay | Admitting: Family Medicine

## 2015-12-25 NOTE — Telephone Encounter (Signed)
Per Dr. Clent RidgesFry call in Cipro 500 mg bid X 7 days, she has UTI, recently gave urine sample and had labs done.

## 2015-12-25 NOTE — Telephone Encounter (Signed)
I left a voice message for pt to return my call, I have lab results to go over and need to know what pharmacy to send in script.

## 2015-12-25 NOTE — Telephone Encounter (Signed)
I left another voice message for pt to return my call.  

## 2015-12-30 MED ORDER — CIPROFLOXACIN HCL 500 MG PO TABS: 500.0000 mg | ORAL_TABLET | Freq: Two times a day (BID) | ORAL | 0 refills | Status: DC

## 2015-12-30 NOTE — Telephone Encounter (Signed)
I left a voice message on pt's work number for her to call us back.

## 2015-12-30 NOTE — Telephone Encounter (Signed)
I spoke with pt, [facility]

## 2015-12-30 NOTE — Addendum Note (Signed)
Addended by: Aniceto BossNIMMONS, Jaeceon Michelin A on: 12/30/2015 05:31 PM   Modules accepted: Orders

## 2016-01-14 ENCOUNTER — Ambulatory Visit (INDEPENDENT_AMBULATORY_CARE_PROVIDER_SITE_OTHER): Payer: 59 | Admitting: Family Medicine

## 2016-01-14 ENCOUNTER — Encounter: Payer: Self-pay | Admitting: Family Medicine

## 2016-01-14 VITALS — BP 96/62 | HR 96 | Temp 98.2°F | Ht 65.25 in | Wt 123.0 lb

## 2016-01-14 DIAGNOSIS — Z0000 Encounter for general adult medical examination without abnormal findings: Secondary | ICD-10-CM | POA: Diagnosis not present

## 2016-01-14 LAB — POC URINALSYSI DIPSTICK (AUTOMATED)
BILIRUBIN UA: NEGATIVE
Blood, UA: NEGATIVE
GLUCOSE UA: NEGATIVE
KETONES UA: NEGATIVE
LEUKOCYTES UA: NEGATIVE
NITRITE UA: NEGATIVE
PH UA: 6
Spec Grav, UA: >=1.03
Urobilinogen, UA: 0.2

## 2016-01-14 MED ORDER — HYDROCODONE-ACETAMINOPHEN 5-325 MG PO TABS: ORAL_TABLET | ORAL | 0 refills | Status: DC

## 2016-01-14 MED ORDER — FLUOXETINE HCL 40 MG PO CAPS: ORAL_CAPSULE | ORAL | 3 refills | Status: DC

## 2016-01-14 MED ORDER — OMEPRAZOLE 40 MG PO CPDR: 40.0000 mg | DELAYED_RELEASE_CAPSULE | Freq: Every day | ORAL | 3 refills | Status: DC

## 2016-01-14 MED ORDER — VARENICLINE TARTRATE 0.5 MG X 11 & 1 MG X 42 PO MISC: ORAL | 0 refills | Status: DC

## 2016-01-14 MED ORDER — ALPRAZOLAM 0.5 MG PO TABS: ORAL_TABLET | ORAL | 5 refills | Status: DC

## 2016-01-14 MED ORDER — VARENICLINE TARTRATE 1 MG PO TABS: 1.0000 mg | ORAL_TABLET | Freq: Two times a day (BID) | ORAL | 1 refills | Status: DC

## 2016-01-14 MED ORDER — SUMATRIPTAN SUCCINATE 100 MG PO TABS: ORAL_TABLET | ORAL | 11 refills | Status: DC

## 2016-01-14 NOTE — Progress Notes (Signed)
Pre visit review using our clinic review tool, if applicable. No additional management support is needed unless otherwise documented below in the visit note. 

## 2016-01-14 NOTE — Addendum Note (Signed)
Addended by: Aniceto BossNIMMONS, SYLVIA A on: 01/14/2016 10:57 AM   Modules accepted: Orders

## 2016-01-14 NOTE — Progress Notes (Signed)
   Subjective:    Patient ID: Sarah Gay, female    DOB: Jul 19, 1965, 50 y.o.   MRN: [id number]  HPI 50 yr old female for a well exam. She has a few items to discuss. For the past 6 months she has noticed that both feet will frequently turn a dark color, either gray or blue, and that they often feel cold. Both feet will also tingle and get numb at times. No swelling. This does not happen to her hands. She is not a vegetarian but she seldom eats meat and she does not take vitamins. She is interested in quitting smoking. She has tried Wellbutrin in the past without success. She has also tried nicotine patches in the past without success.    Review of Systems  Constitutional: Negative.   HENT: Negative.   Eyes: Negative.   Respiratory: Negative.   Cardiovascular: Negative.   Gastrointestinal: Negative.   Genitourinary: Negative for decreased urine volume, difficulty urinating, dyspareunia, dysuria, enuresis, flank pain, frequency, hematuria, pelvic pain and urgency.  Musculoskeletal: Negative.   Skin: Positive for color change. Negative for pallor, rash and wound.  Neurological: Positive for numbness. Negative for dizziness, tremors, seizures, syncope, facial asymmetry, speech difficulty, weakness, light-headedness and headaches.  Psychiatric/Behavioral: Negative.        Objective:   Physical Exam  Constitutional: She is oriented to person, place, and time. She appears well-developed and well-nourished. No distress.  HENT:  Head: Normocephalic and atraumatic.  Right Ear: External ear normal.  Left Ear: External ear normal.  Nose: Nose normal.  Mouth/Throat: Oropharynx is clear and moist. No oropharyngeal exudate.  Eyes: Conjunctivae and EOM are normal. Pupils are equal, round, and reactive to light. No scleral icterus.  Neck: Normal range of motion. Neck supple. No JVD present. No thyromegaly present.  Cardiovascular: Normal rate, regular rhythm, normal heart sounds and intact distal  pulses.  Exam reveals no gallop and no friction rub.   No murmur heard. Pulmonary/Chest: Effort normal and breath sounds normal. No respiratory distress. She has no wheezes. She has no rales. She exhibits no tenderness.  Abdominal: Soft. Bowel sounds are normal. She exhibits no distension and no mass. There is no tenderness. There is no rebound and no guarding.  Musculoskeletal: Normal range of motion. She exhibits no edema or tenderness.  Lymphadenopathy:    She has no cervical adenopathy.  Neurological: She is alert and oriented to person, place, and time. She has normal reflexes. No cranial nerve deficit. She exhibits normal muscle tone. Coordination normal.  Skin: Skin is warm and dry. No rash noted. No erythema.  Psychiatric: She has a normal mood and affect. Her behavior is normal. Judgment and thought content normal.          Assessment & Plan:  Well exam. We discussed diet and exercise. She will try Chantix to help her quit smoking. We treated her for an apparent UTI seen on her labs with Cipro, and today her UA is clear. She had an elevated glucose on her labs but she admits she was not fasting the morning these were drawn. We will get an A1c to check this. 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 Send for a colonoscopy.  Nelwyn SalisburyFRY,STEPHEN A, MD

## 2016-01-15 ENCOUNTER — Encounter: Payer: Self-pay | Admitting: Gastroenterology

## 2016-01-20 ENCOUNTER — Encounter: Payer: Self-pay | Admitting: Family Medicine

## 2016-02-15 ENCOUNTER — Encounter: Payer: Self-pay | Admitting: Family Medicine

## 2016-02-15 ENCOUNTER — Ambulatory Visit (INDEPENDENT_AMBULATORY_CARE_PROVIDER_SITE_OTHER): Payer: 59 | Admitting: Family Medicine

## 2016-02-15 ENCOUNTER — Telehealth: Payer: Self-pay | Admitting: Family Medicine

## 2016-02-15 VITALS — BP 103/83 | HR 96 | Temp 98.3°F | Ht 65.25 in | Wt 129.0 lb

## 2016-02-15 DIAGNOSIS — J209 Acute bronchitis, unspecified: Secondary | ICD-10-CM

## 2016-02-15 MED ORDER — METHYLPREDNISOLONE 4 MG PO TBPK: ORAL_TABLET | ORAL | 0 refills | Status: DC

## 2016-02-15 MED ORDER — AMOXICILLIN-POT CLAVULANATE 875-125 MG PO TABS: 1.0000 | ORAL_TABLET | Freq: Two times a day (BID) | ORAL | 0 refills | Status: DC

## 2016-02-15 NOTE — Progress Notes (Signed)
   Subjective:    Patient ID: Sarah Gay, female    DOB: [birth year], 50 y.o.   MRN: [id number]  HPI Here for 3 days of stuffy head, PND, chest congestion and coughing up green sputum. No chest pain or fever. Drinking fluids.    Review of Systems  Constitutional: Negative.   HENT: Positive for congestion, postnasal drip and sinus pressure. Negative for sore throat.   Eyes: Negative.   Respiratory: Positive for cough, chest tightness and wheezing.   Cardiovascular: Negative.        Objective:   Physical Exam  Constitutional: She appears well-developed and well-nourished.  HENT:  Right Ear: External ear normal.  Left Ear: External ear normal.  Nose: Nose normal.  Mouth/Throat: Oropharynx is clear and moist.  Eyes: Conjunctivae are normal.  Neck: No thyromegaly present.  Pulmonary/Chest: Effort normal. No respiratory distress. She has no rales.  Scattered rhonchi and wheezes   Lymphadenopathy:    She has no cervical adenopathy.          Assessment & Plan:  Bronchitis, treat with Augmentin. Add Mucinex prn. Nelwyn SalisburyFRY,STEPHEN A, MD

## 2016-02-15 NOTE — Telephone Encounter (Signed)
We will see pt this afternoon.

## 2016-02-15 NOTE — Telephone Encounter (Signed)
Pt states she has a possible sinus infection. Cough, watery eyes, nasal congestion Would like to know if dr Clent RidgesFry will call in a ZPAK.  Pt has made appt for 4:15 today.  Advised pt we can cancel appt if Rx can be called in.  [facility] [id number]

## 2016-02-15 NOTE — Progress Notes (Signed)
Pre visit review using our clinic review tool, if applicable. No additional management support is needed unless otherwise documented below in the visit note. 

## 2016-02-15 NOTE — Telephone Encounter (Signed)
ok 

## 2016-03-04 ENCOUNTER — Encounter: Payer: Self-pay | Admitting: Gastroenterology

## 2016-03-04 ENCOUNTER — Ambulatory Visit (AMBULATORY_SURGERY_CENTER): Payer: Self-pay

## 2016-03-04 VITALS — Ht 66.0 in | Wt 127.6 lb

## 2016-03-04 DIAGNOSIS — Z1211 Encounter for screening for malignant neoplasm of colon: Secondary | ICD-10-CM

## 2016-03-04 MED ORDER — SUPREP BOWEL PREP KIT 17.5-3.13-1.6 GM/177ML PO SOLN: 1.0000 | Freq: Once | ORAL | 0 refills | Status: AC

## 2016-03-04 NOTE — Progress Notes (Signed)
No allergies to eggs or soy No past problems with anesthesia No diet meds No home oxygen  Registered for emmi 

## 2016-03-18 ENCOUNTER — Ambulatory Visit (AMBULATORY_SURGERY_CENTER): Payer: 59 | Admitting: Gastroenterology

## 2016-03-18 ENCOUNTER — Encounter: Payer: Self-pay | Admitting: Gastroenterology

## 2016-03-18 VITALS — BP 88/68 | HR 75 | Temp 99.3°F | Resp 16 | Ht 66.0 in | Wt 127.0 lb

## 2016-03-18 DIAGNOSIS — Z1211 Encounter for screening for malignant neoplasm of colon: Secondary | ICD-10-CM | POA: Diagnosis not present

## 2016-03-18 DIAGNOSIS — Z1212 Encounter for screening for malignant neoplasm of rectum: Secondary | ICD-10-CM

## 2016-03-18 HISTORY — PX: COLONOSCOPY: SHX174

## 2016-03-18 MED ORDER — SODIUM CHLORIDE 0.9 % IV SOLN: 500.0000 mL | INTRAVENOUS | Status: AC

## 2016-03-18 NOTE — Progress Notes (Signed)
To recovery, report to Mirts, RN, VSS. 

## 2016-03-18 NOTE — Progress Notes (Signed)
Pt states bp runs low, bp is usually 80s/60s or 90s/60s, pt denies any dizziness with low bp, pt given discharge instruction,-adm

## 2016-03-18 NOTE — Patient Instructions (Signed)
YOU HAD AN ENDOSCOPIC PROCEDURE TODAY AT THE [facility]:   Refer to the procedure report that was given to you for any specific questions about what was found during the examination.  If the procedure report does not answer your questions, please call your gastroenterologist to clarify.  If you requested that your care partner not be given the details of your procedure findings, then the procedure report has been included in a sealed envelope for you to review at your convenience later.  YOU SHOULD EXPECT: Some feelings of bloating in the abdomen. Passage of more gas than usual.  Walking can help get rid of the air that was put into your GI tract during the procedure and reduce the bloating. If you had a lower endoscopy (such as a colonoscopy or flexible sigmoidoscopy) you may notice spotting of blood in your stool or on the toilet paper. If you underwent a bowel prep for your procedure, you may not have a normal bowel movement for a few days.  Please Note:  You might notice some irritation and congestion in your nose or some drainage.  This is from the oxygen used during your procedure.  There is no need for concern and it should clear up in a day or so.  SYMPTOMS TO REPORT IMMEDIATELY:   Following lower endoscopy (colonoscopy or flexible sigmoidoscopy):  Excessive amounts of blood in the stool  Significant tenderness or worsening of abdominal pains  Swelling of the abdomen that is new, acute  Fever of 100F or higher   For urgent or emergent issues, a gastroenterologist can be reached at any hour by calling [phone].   DIET:  We do recommend a small meal at first, but then you may proceed to your regular diet.  Drink plenty of fluids but you should avoid alcoholic beverages for 24 hours.  ACTIVITY:  You should plan to take it easy for the rest of today and you should NOT DRIVE or use heavy machinery until tomorrow (because of the sedation medicines used during the test).     FOLLOW UP: Our staff will call the number listed on your records the next business day following your procedure to check on you and address any questions or concerns that you may have regarding the information given to you following your procedure. If we do not reach you, we will leave a message.  However, if you are feeling well and you are not experiencing any problems, there is no need to return our call.  We will assume that you have returned to your regular daily activities without incident.  If any biopsies were taken you will be contacted by phone or by letter within the next 1-3 weeks.  Please call us at [phone] if you have not heard about the biopsies in 3 weeks.    SIGNATURES/CONFIDENTIALITY: You and/or your care partner have signed paperwork which will be entered into your electronic medical record.  These signatures attest to the fact that that the information above on your After Visit Summary has been reviewed and is understood.  Full responsibility of the confidentiality of this discharge information lies with you and/or your care-partner.  Repeat colonoscopy in 10 years 2027.  Normal exam.  

## 2016-03-18 NOTE — Op Note (Signed)
[facility] Patient Name: Sarah Gay Procedure Date: 03/18/2016 8:29 AM MRN: [id number] Endoscopist: Rachael Fee , MD Age: 50 Referring MD:  Date of Birth: 1965-07-03 Gender: Female Account #: [id number] Procedure:                Colonoscopy Indications:              Screening for colorectal malignant neoplasm Medicines:                Monitored Anesthesia Care Procedure:                Pre-Anesthesia Assessment:                           - Prior to the procedure, a History and Physical                            was performed, and patient medications and                            allergies were reviewed. The patient's tolerance of                            previous anesthesia was also reviewed. The risks                            and benefits of the procedure and the sedation                            options and risks were discussed with the patient.                            All questions were answered, and informed consent                            was obtained. Prior Anticoagulants: The patient has                            taken no previous anticoagulant or antiplatelet                            agents. ASA Grade Assessment: II - A patient with                            mild systemic disease. After reviewing the risks                            and benefits, the patient was deemed in                            satisfactory condition to undergo the procedure.                           After obtaining informed consent, the colonoscope  was passed under direct vision. Throughout the                            procedure, the patient's blood pressure, pulse, and                            oxygen saturations were monitored continuously. The                            Model CF-HQ190L [id number]) scope was introduced                            through the anus and advanced to the the cecum,                            identified by  appendiceal orifice and ileocecal                            valve. The colonoscopy was performed without                            difficulty. The patient tolerated the procedure                            well. The quality of the bowel preparation was                            good. The ileocecal valve, appendiceal orifice, and                            rectum were photographed. Scope In: 8:32:12 AM Scope Out: 8:46:52 AM Scope Withdrawal Time: 0 hours 9 minutes 1 second  Total Procedure Duration: 0 hours 14 minutes 40 seconds  Findings:                 The entire examined colon appeared normal on direct                            and retroflexion views. Complications:            No immediate complications. Estimated blood loss:                            None. Estimated Blood Loss:     Estimated blood loss: none. Impression:               - The entire examined colon is normal on direct and                            retroflexion views.                           - No specimens collected. Recommendation:           - Patient has a contact number available for  emergencies. The signs and symptoms of potential                            delayed complications were discussed with the                            patient. Return to normal activities tomorrow.                            Written discharge instructions were provided to the                            patient.                           - Resume previous diet.                           - Continue present medications.                           - Repeat colonoscopy in 10 years for screening                            purposes. There is no need for colon cancer                            screening by any method (including stool testing)                            prior to then. Rachael Feeaniel P Jacobs, MD 03/18/2016 8:48:51 AM This report has been signed electronically.

## 2016-03-21 ENCOUNTER — Telehealth: Payer: Self-pay

## 2016-03-21 NOTE — Telephone Encounter (Signed)
No answer. Number identifier. Message left we will attempt to call today.

## 2016-03-21 NOTE — Telephone Encounter (Signed)
  Follow up Call-  Call back number 03/18/2016  Post procedure Call Back phone  # [phone]  Permission to leave phone message Yes  Some recent data might be hidden    Patient was called for follow up after her procedure on 03/18/2016. No answer at the number given for follow up phone call. A message was left on the answering machine.

## 2016-06-08 ENCOUNTER — Telehealth: Payer: Self-pay | Admitting: Family Medicine

## 2016-06-08 MED ORDER — HYDROCODONE-ACETAMINOPHEN 5-325 MG PO TABS: ORAL_TABLET | ORAL | 0 refills | Status: DC

## 2016-06-08 NOTE — Telephone Encounter (Signed)
Please advise 

## 2016-06-08 NOTE — Telephone Encounter (Signed)
Pt need new Rx for Hydrocodone   Pt is aware of the 3 business days for refills °

## 2016-06-08 NOTE — Telephone Encounter (Signed)
Ready for pick up

## 2016-06-13 ENCOUNTER — Telehealth: Payer: Self-pay

## 2016-06-13 NOTE — Telephone Encounter (Signed)
Received PA request from [facility] for Hydrocodone-acetaminophen 5-325 tablets. PA submitted & is pending. Key: [id number]

## 2016-06-13 NOTE — Telephone Encounter (Signed)
PA approved, form faxed back to pharmacy. 

## 2016-08-05 ENCOUNTER — Other Ambulatory Visit: Payer: Self-pay | Admitting: Family Medicine

## 2016-08-05 NOTE — Telephone Encounter (Signed)
Rx phoned in.   

## 2016-08-05 NOTE — Telephone Encounter (Signed)
Call in #60 with 5 rf 

## 2016-08-22 ENCOUNTER — Telehealth: Payer: Self-pay | Admitting: Family Medicine

## 2016-08-22 NOTE — Telephone Encounter (Signed)
° ° ° °  Pt said her insurance will not cover CHANTIX and is asking if there is something equal to Chantix

## 2016-08-23 NOTE — Telephone Encounter (Signed)
° ° ° °  Spoke with pt she said she just may pay out of pocket. Nothing else needed at this time

## 2016-08-23 NOTE — Telephone Encounter (Signed)
No there is nothing else like Chantix. She could try OTC nicotine patches.

## 2016-10-27 ENCOUNTER — Telehealth: Payer: Self-pay | Admitting: Family Medicine

## 2016-10-27 MED ORDER — HYDROCODONE-ACETAMINOPHEN 5-325 MG PO TABS: ORAL_TABLET | ORAL | 0 refills | Status: DC

## 2016-10-27 MED ORDER — HYDROCODONE-ACETAMINOPHEN 5-325 MG PO TABS
ORAL_TABLET | ORAL | 0 refills | Status: DC
Start: 2016-10-27 — End: 2017-04-05

## 2016-10-27 NOTE — Telephone Encounter (Signed)
Done

## 2016-10-27 NOTE — Telephone Encounter (Signed)
° ° ° ° ° °  Pt request refill of the following: ° °HYDROcodone-acetaminophen (NORCO/VICODIN) 5-325 MG tablet ° ° °Phamacy: °

## 2016-10-28 NOTE — Telephone Encounter (Signed)
Script is ready for pick up here at front office and I left a message.  

## 2017-02-11 ENCOUNTER — Other Ambulatory Visit: Payer: Self-pay | Admitting: Family Medicine

## 2017-03-10 ENCOUNTER — Other Ambulatory Visit: Payer: Self-pay | Admitting: Family Medicine

## 2017-03-10 NOTE — Telephone Encounter (Signed)
Copied from CRM [id number]. Topic: Quick Communication - See Telephone Encounter >> Mar 10, 2017 10:32 AM Viviann SpareWhite, Selina wrote: Patient calling for refill on the following meds. Patient is out of her meds and she has a migraine today.  ALPRAZolam (XANAX) 0.5 MG tablet SUMAtriptan (IMITREX) 100 MG tablet  CRM for notification. See Telephone encounter for: 03/10/17.

## 2017-03-13 NOTE — Telephone Encounter (Signed)
Call in Alprazolam #60 with no rf, also Sumatriptan #9 with no rf. She will need an OV soon (last seen over a year ago)

## 2017-03-13 NOTE — Telephone Encounter (Signed)
Requesting  A  Refill   Of  Xanax  And   Imitrex

## 2017-03-13 NOTE — Telephone Encounter (Signed)
Sent to PCP for approval. Last OV was a year ago 02/15/2016. Xanax was last refilled 08/05/2016 disp 60 with 5 refills. Imitrex was last refilled 01/14/2016 disp 9 with 11 refills.

## 2017-03-29 ENCOUNTER — Other Ambulatory Visit: Payer: Self-pay | Admitting: Family Medicine

## 2017-03-30 NOTE — Telephone Encounter (Signed)
Sent to PCP for approval.  

## 2017-03-31 ENCOUNTER — Telehealth: Payer: Self-pay | Admitting: Family Medicine

## 2017-03-31 NOTE — Telephone Encounter (Signed)
I spoke with pt and refill is for Xanax, she is currently taking 3 times a day and the recent supply sent in does not last 30 days. Pt would like a call back [facility]

## 2017-03-31 NOTE — Telephone Encounter (Signed)
Pt said she only has 6 tablets left.

## 2017-03-31 NOTE — Telephone Encounter (Signed)
Sent to PCP for approval to refill medication with dose increase.

## 2017-03-31 NOTE — Telephone Encounter (Signed)
Call in #60 but no more until she has an OV

## 2017-03-31 NOTE — Telephone Encounter (Signed)
Copied from CRM [phone]. Topic: Quick Communication - See Telephone Encounter >> Mar 31, 2017  2:25 PM Rudi CocoLathan, Yuriko Portales M, [place] wrote: CRM for notification. See Telephone encounter for:   03/31/17. Pt. Would like for Dr. Clent RidgesFry or Nurse to give her call asking about med. Refill let pt. Know refills can take up to 3 business days to be refilled. Pt can be reached at [phone]

## 2017-04-03 ENCOUNTER — Other Ambulatory Visit: Payer: Self-pay | Admitting: Family Medicine

## 2017-04-03 NOTE — Telephone Encounter (Signed)
Pt is due for an OV for more refills. 

## 2017-04-04 MED ORDER — ALPRAZOLAM 0.5 MG PO TABS: 0.5000 mg | ORAL_TABLET | Freq: Three times a day (TID) | ORAL | 0 refills | Status: DC | PRN

## 2017-04-04 NOTE — Telephone Encounter (Signed)
Call in #90 with no rf. She needs an OV soon  

## 2017-04-04 NOTE — Telephone Encounter (Signed)
Medication phoned to pharmacy as requested.  Appt scheduled for tomorrow.

## 2017-04-05 ENCOUNTER — Ambulatory Visit: Payer: 59 | Admitting: Family Medicine

## 2017-04-05 ENCOUNTER — Encounter: Payer: Self-pay | Admitting: Family Medicine

## 2017-04-05 VITALS — BP 98/60 | HR 87 | Temp 98.6°F | Wt 121.8 lb

## 2017-04-05 DIAGNOSIS — F418 Other specified anxiety disorders: Secondary | ICD-10-CM | POA: Diagnosis not present

## 2017-04-05 MED ORDER — ALPRAZOLAM 0.5 MG PO TABS: 0.5000 mg | ORAL_TABLET | Freq: Three times a day (TID) | ORAL | 2 refills | Status: DC | PRN

## 2017-04-05 MED ORDER — HYDROCODONE-ACETAMINOPHEN 5-325 MG PO TABS: ORAL_TABLET | ORAL | 0 refills | Status: DC

## 2017-04-05 MED ORDER — SUMATRIPTAN SUCCINATE 100 MG PO TABS: ORAL_TABLET | ORAL | 11 refills | Status: DC

## 2017-04-05 MED ORDER — DULOXETINE HCL 30 MG PO CPEP: 30.0000 mg | ORAL_CAPSULE | Freq: Two times a day (BID) | ORAL | 2 refills | Status: DC

## 2017-04-05 NOTE — Progress Notes (Signed)
   Subjective:    Patient ID: Sarah Gay, female    DOB: 1966/03/28, 51 y.o.   MRN: [id number]  HPI Here to follow up on anxiety and depression. She had been doing well until 2 months ago when issues began to affect her life. She had been seeing a steady boyfriend for the past 2 years but he recently told her he wanted to break up. She has also had other family issues come up. She has been very depressed and anxious. She started herself back on Prozac 40 mg bid for a few weeks along with prn Xanax, but this has not helped at all. She feels sad, anxious, and overwhelmed.    Review of Systems  Constitutional: Negative.   Respiratory: Negative.   Cardiovascular: Negative.   Neurological: Negative.   Psychiatric/Behavioral: Positive for decreased concentration, dysphoric mood and sleep disturbance. Negative for agitation, behavioral problems, confusion and hallucinations. The patient is nervous/anxious.        Objective:   Physical Exam  Constitutional: She is oriented to person, place, and time. She appears well-developed and well-nourished.  Cardiovascular: Normal rate, regular rhythm, normal heart sounds and intact distal pulses.  Pulmonary/Chest: Effort normal and breath sounds normal. No respiratory distress. She has no wheezes. She has no rales.  Neurological: She is alert and oriented to person, place, and time.  Psychiatric: She has a normal mood and affect. Her behavior is normal. Judgment and thought content normal.          Assessment & Plan:  Depression with anxiety. Stop the Prozac and try Cymbalta 30 mg bid. Use Xanax prn. Recheck in 2-3 weeks.  Gershon CraneStephen Fry, MD

## 2017-05-05 ENCOUNTER — Encounter: Payer: Self-pay | Admitting: Family Medicine

## 2017-05-05 ENCOUNTER — Ambulatory Visit: Payer: 59 | Admitting: Family Medicine

## 2017-05-05 VITALS — BP 118/76 | HR 91 | Temp 99.1°F | Resp 12 | Ht 66.0 in | Wt 120.0 lb

## 2017-05-05 DIAGNOSIS — M545 Low back pain, unspecified: Secondary | ICD-10-CM

## 2017-05-05 DIAGNOSIS — J069 Acute upper respiratory infection, unspecified: Secondary | ICD-10-CM

## 2017-05-05 DIAGNOSIS — F418 Other specified anxiety disorders: Secondary | ICD-10-CM | POA: Diagnosis not present

## 2017-05-05 LAB — POC INFLUENZA A&B (BINAX/QUICKVUE)
INFLUENZA A, POC: NEGATIVE
Influenza B, POC: NEGATIVE

## 2017-05-05 MED ORDER — FLUTICASONE PROPIONATE 50 MCG/ACT NA SUSP: 1.0000 | Freq: Two times a day (BID) | NASAL | 0 refills | Status: DC

## 2017-05-05 MED ORDER — BENZONATATE 100 MG PO CAPS: 200.0000 mg | ORAL_CAPSULE | Freq: Two times a day (BID) | ORAL | 0 refills | Status: AC | PRN

## 2017-05-05 NOTE — Progress Notes (Signed)
ACUTE VISIT  HPI:  Chief Complaint  Patient presents with  . Nasal Congestion    started yesterday  . Sinusitis    started yesterday  . Hip Pain    bilateral lower hip pain, started 3 weeks ago    [name] is a 52 y.o.female here today complaining of respiratory symptoms that started yesterday. . In the past when she has had similar symptoms she has been treated with azithromycin or amoxicillin. So she would like a Rx for abx today, she is certain she is going to get worse because it happens every year.  She is not sure about fever but felt "little" chills this morning.  Symptoms were worse this morning and feeling mildly better now.  URI   This is a new problem. The current episode started yesterday. The problem has been gradually improving. Associated symptoms include congestion, coughing, headaches (frontal pressure), rhinorrhea and a sore throat. Pertinent negatives include no abdominal pain, chest pain, diarrhea, dysuria, ear pain, nausea, neck pain, plugged ear sensation, rash, swollen glands or vomiting. She has tried nothing for the symptoms.   +Frontal/sinus pressure.  +Non productive cough.  No Hx of recent travel. + Sick contact, several coworkers have been sick recently. No known insect bite.  Hx of allergies: Denies. + Tobacco use: She tells me that she is no longer smoking.  OTC medications for this problem: None  Symptoms otherwise stable.  Hip pain: She is also complaining of 3 weeks of bilateral lower back pain, constant, dull, "discomfort." Pain is not radiated. She cannot identify exacerbating or alleviating factors. She has not tried any OTC medication. He is concerned about this being related to her weight loss. She states that she does not have much appetite, she tries to eat but is still losing weight.  According to patient she has discussed this with her PCP.  History of anxiety and depression. She is also concerned about  taking Cymbalta, which according to patient was recently prescribed, changed from Prozac. She is afraid of this medication being "addictive" and/or causing "nerve damage." She is not reporting suicidal thoughts.  She is also on Alprazolam 0.5 mg prn on Hydrocodone-Acetaminophen 5-325 mg as needed for migraine headaches.   Review of Systems  Constitutional: Positive for appetite change, chills and fatigue. Negative for activity change.  HENT: Positive for congestion, postnasal drip, rhinorrhea, sinus pressure, sore throat and voice change. Negative for ear pain, facial swelling, mouth sores and trouble swallowing.   Eyes: Negative for discharge, redness and itching.  Respiratory: Positive for cough. Negative for shortness of breath.   Cardiovascular: Negative for chest pain and leg swelling.  Gastrointestinal: Negative for abdominal pain, diarrhea, nausea and vomiting.  Genitourinary: Negative for decreased urine volume, dysuria and hematuria.  Musculoskeletal: Positive for back pain. Negative for gait problem and neck pain.  Skin: Negative for rash.  Allergic/Immunologic: Negative for environmental allergies.  Neurological: Positive for headaches (frontal pressure). Negative for syncope, weakness and numbness.  Hematological: Negative for adenopathy. Does not bruise/bleed easily.  Psychiatric/Behavioral: Negative for confusion. The patient is nervous/anxious.       Current Outpatient Medications on File Prior to Visit  Medication Sig Dispense Refill  . ALPRAZolam (XANAX) 0.5 MG tablet Take 1 tablet (0.5 mg total) by mouth 3 (three) times daily as needed. 90 tablet 2  . DULoxetine (CYMBALTA) 30 MG capsule Take 1 capsule (30 mg total) by mouth 2 (two) times daily. 60 capsule 2  .  HYDROcodone-acetaminophen (NORCO/VICODIN) 5-325 MG tablet take 1 tablet by mouth every 4 hours if needed 120 tablet 0  . Melatonin 5 MG TABS Take 1 each by mouth at bedtime.      Marland Kitchen omeprazole (PRILOSEC) 40 MG  capsule TAKE 1 CAPSULE BY MOUTH  DAILY 30 capsule 0  . SUMAtriptan (IMITREX) 100 MG tablet TAKE 1 TABLET BY MOUTH AS DIRECTED AS NEEDED FOR MIGRAINES 9 tablet 11  . varenicline (CHANTIX CONTINUING MONTH PAK) 1 MG tablet Take 1 tablet (1 mg total) by mouth 2 (two) times daily. 60 tablet 1   Current Facility-Administered Medications on File Prior to Visit  Medication Dose Route Frequency Provider Last Rate Last Dose  . 0.9 %  sodium chloride infusion  500 mL Intravenous Continuous Rachael Fee, MD         Past Medical History:  Diagnosis Date  . Asthma   . Depression   . GERD (gastroesophageal reflux disease)   . Migraines   . Mononucleosis    as a child    Allergies  Allergen Reactions  . Sulfonamide Derivatives     Social History   Socioeconomic History  . Marital status: Married    Spouse name: None  . Number of children: None  . Years of education: None  . Highest education level: None  Social Needs  . Financial resource strain: None  . Food insecurity - worry: None  . Food insecurity - inability: None  . Transportation needs - medical: None  . Transportation needs - non-medical: None  Occupational History  . None  Tobacco Use  . Smoking status: Current Some Day Smoker    Types: Cigarettes  . Smokeless tobacco: Never Used  Substance and Sexual Activity  . Alcohol use: Yes    Alcohol/week: 0.0 oz    Comment: 2 weekly wine  . Drug use: No  . Sexual activity: None  Other Topics Concern  . None  Social History Narrative  . None    Vitals:   05/05/17 1453  BP: 118/76  Pulse: 91  Resp: 12  Temp: 99.1 F (37.3 C)  SpO2: 96%   Body mass index is 19.37 kg/m.  Physical Exam  Nursing note and vitals reviewed. Constitutional: She is oriented to person, place, and time. She appears well-developed and well-nourished. She does not appear ill. No distress.  HENT:  Head: Normocephalic and atraumatic.  Right Ear: Tympanic membrane, external ear and ear  canal normal.  Left Ear: Tympanic membrane, external ear and ear canal normal.  Nose: Rhinorrhea present. Right sinus exhibits no maxillary sinus tenderness and no frontal sinus tenderness. Left sinus exhibits no maxillary sinus tenderness and no frontal sinus tenderness.  Mouth/Throat: Oropharynx is clear and moist and mucous membranes are normal.  Hypertrophic turbinates. Mild dysphonia.  Eyes: Conjunctivae are normal.  Neck: No muscular tenderness present. No edema and no erythema present.  Cardiovascular: Normal rate and regular rhythm.  No murmur heard. Respiratory: Effort normal and breath sounds normal. No stridor. No respiratory distress.  Nonproductive cough a couple times during visit.  GI: Soft. She exhibits no mass. There is no tenderness.  Musculoskeletal: She exhibits no edema.       Right hip: She exhibits normal range of motion, no tenderness and no bony tenderness.       Left hip: She exhibits normal range of motion, no tenderness and no bony tenderness.       Lumbar back: She exhibits no bony tenderness.  Tenderness upon  palpation of right paraspinal lumbar muscles.  Lymphadenopathy:       Head (right side): No submandibular adenopathy present.       Head (left side): No submandibular adenopathy present.    She has no cervical adenopathy.  Neurological: She is alert and oriented to person, place, and time. She has normal strength. Gait normal.  SLR negative bilateral.  Skin: Skin is warm. No rash noted. No erythema.  Psychiatric: Her mood appears anxious.  Well groomed, good eye contact.    ASSESSMENT AND PLAN:   Sarah Gay was seen today for nasal congestion, sinusitis and hip pain.  Diagnoses and all orders for this visit:  URI, acute  Rapid flu test negative.   Symptoms suggests a viral etiology, I explained that symptomatic treatment is usually recommended in this case, so I do not think abx is needed at this time. Instructed to monitor for signs of  complications,clearly instructed about warning signs. Because symptoms just started yesterday, she was instructed to monitor for new symptoms. I also explained that cough and nasal congestion can last a few days and sometimes weeks. F/U as needed.  -     benzonatate (TESSALON) 100 MG capsule; Take 2 capsules (200 mg total) by mouth 2 (two) times daily as needed for up to 10 days. -     fluticasone (FLONASE) 50 MCG/ACT nasal spray; Place 1 spray into both nostrils 2 (two) times daily. -     POC Influenza A&B (Binax test)  Bilateral low back pain without sciatica, unspecified chronicity  Seems to be musculoskeletal. Explained that at this time I do not find any thing that suggest a serious process. Continue monitoring symptoms, she can try OTC Acetaminophen and/or topical icy hot or Aspercreme. Instructed about warning signs. I do not think imaging is needed today but I still offered it, she decides to hold on imaging for now.  Depression with anxiety  Educated about side effects of Cymbalta as well as different indications for which this medication is used. Recommend following with PCP in 1-2 weeks. Instructed about warning signs.  In regard to her concern about weight loss, it seems to be related to poor appetite/decreased oral intake. Recommend continuing following with PCP.    -Ms. Sarah Gay advised to seek attention immediately if symptoms worsen or to follow if they persist or new concerns arise.       Sarah G. SwazilandJordan, MD  [facility]. [place] office.

## 2017-05-05 NOTE — Patient Instructions (Addendum)
[name] I have seen you today for an acute visit.  A few things to remember from today's visit:   URI, acute  Depression with anxiety  Bilateral low back pain without sciatica, unspecified chronicity   Medications prescribed today are intended for short period of time and will not be refill upon request, a follow up appointment might be necessary to discuss continuation of of treatment if appropriate.   viral infections are self-limited and we treat each symptom depending of severity.  Antibiotics do not help.  Over the counter medications as decongestants and cold medications usually help, they need to be taken with caution if there is a history of high blood pressure or palpitations. Tylenol and/or Ibuprofen also helps with most symptoms (headache, muscle aching, fever,etc) Plenty of fluids. Honey helps with cough. Steam inhalations helps with runny nose, nasal congestion, and may prevent sinus infections. Cough and nasal congestion could last a few days and sometimes weeks. Please follow in not any better in 1-2 weeks or if symptoms get worse.   Lower back pain seems to be muscle related, I did not find signs that suggest a serious illness. Over-the-counter icy hot patch or topical Aspercreme might help.  Duloxetine delayed-release capsules What is this medicine? DULOXETINE (doo LOX e teen) is used to treat depression, anxiety, and different types of chronic pain. This medicine may be used for other purposes; ask your health care provider or pharmacist if you have questions. COMMON BRAND NAME(S): Cymbalta, Irenka  What should I tell my health care provider before I take this medicine? They need to know if you have any of these conditions: -bipolar disorder or a family history of bipolar disorder -glaucoma -kidney disease -liver disease -suicidal thoughts or a previous suicide attempt -taken medicines called MAOIs like Carbex, Eldepryl, Marplan, Nardil, and  Parnate within 14 days -an unusual reaction to duloxetine, other medicines, foods, dyes, or preservatives -pregnant or trying to get pregnant -breast-feeding How should I use this medicine? Take this medicine by mouth with a glass of water. Follow the directions on the prescription label. Do not cut, crush or chew this medicine. You can take this medicine with or without food. Take your medicine at regular intervals. Do not take your medicine more often than directed. Do not stop taking this medicine suddenly except upon the advice of your doctor. Stopping this medicine too quickly may cause serious side effects or your condition may worsen. A special MedGuide will be given to you by the pharmacist with each prescription and refill. Be sure to read this information carefully each time. Talk to your pediatrician regarding the use of this medicine in children. While this drug may be prescribed for children as young as 437 years of age for selected conditions, precautions do apply. Overdosage: If you think you have taken too much of this medicine contact a poison control center or emergency room at once. NOTE: This medicine is only for you. Do not share this medicine with others. What if I miss a dose? If you miss a dose, take it as soon as you can. If it is almost time for your next dose, take only that dose. Do not take double or extra doses.   What should I watch for while using this medicine? Tell your doctor if your symptoms do not get better or if they get worse. Visit your doctor or health care professional for regular checks on your progress. Because it may take several weeks to see the  full effects of this medicine, it is important to continue your treatment as prescribed by your doctor. Patients and their families should watch out for new or worsening thoughts of suicide or depression. Also watch out for sudden changes in feelings such as feeling anxious, agitated, panicky, irritable, hostile,  aggressive, impulsive, severely restless, overly excited and hyperactive, or not being able to sleep. If this happens, especially at the beginning of treatment or after a change in dose, call your health care professional. Sarah Gay may get drowsy or dizzy. Do not drive, use machinery, or do anything that needs mental alertness until you know how this medicine affects you. Do not stand or sit up quickly, especially if you are an older patient. This reduces the risk of dizzy or fainting spells. Alcohol may interfere with the effect of this medicine. Avoid alcoholic drinks. This medicine can cause an increase in blood pressure. This medicine can also cause a sudden drop in your blood pressure, which may make you feel faint and increase the chance of a fall. These effects are most common when you first start the medicine or when the dose is increased, or during use of other medicines that can cause a sudden drop in blood pressure. Check with your doctor for instructions on monitoring your blood pressure while taking this medicine. Your mouth may get dry. Chewing sugarless gum or sucking hard candy, and drinking plenty of water may help. Contact your doctor if the problem does not go away or is severe.  What side effects may I notice from receiving this medicine? Most of the time this medication is well tolerated.  Side effects that you should report to your doctor or health care professional as soon as possible: -allergic reactions like skin rash, itching or hives, swelling of the face, lips, or tongue -anxious -breathing problems -confusion -changes in vision -chest pain -confusion -elevated mood, decreased need for sleep, racing thoughts, impulsive behavior -eye pain -fast, irregular heartbeat -feeling faint or lightheaded, falls -feeling agitated, angry, or irritable -hallucination, loss of contact with reality -high blood pressure -loss of balance or coordination -palpitations -redness,  blistering, peeling or loosening of the skin, including inside the mouth -restlessness, pacing, inability to keep still -seizures -stiff muscles -suicidal thoughts or other mood changes -trouble passing urine or change in the amount of urine -trouble sleeping -unusual bleeding or bruising -unusually weak or tired -vomiting -yellowing of the eyes or skin Side effects that usually do not require medical attention (report to your doctor or health care professional if they continue or are bothersome): -change in sex drive or performance -change in appetite or weight -constipation -dizziness -dry mouth -headache -increased sweating -nausea -tired  This list may not describe all possible side effects. Call your doctor for medical advice about side effects. You may report side effects to FDA at 1-800-FDA-1088.  Where should I keep my medicine? Keep out of the reach of children. Store at room temperature between 20 and 25 degrees C (68 to 77 degrees F). Throw away any unused medicine after the expiration date. NOTE: This sheet is a summary. It may not cover all possible information. If you have questions about this medicine, talk to your doctor, pharmacist, or health care provider.  2018 Elsevier/Gold Standard (2015-09-03 18:16:03)  In general please monitor for signs of worsening symptoms and seek immediate medical attention if any concerning.    I hope you get better soon!

## 2017-05-09 ENCOUNTER — Telehealth: Payer: Self-pay | Admitting: Family Medicine

## 2017-05-09 MED ORDER — AZITHROMYCIN 250 MG PO TABS: ORAL_TABLET | ORAL | 0 refills | Status: DC

## 2017-05-09 MED ORDER — HYDROCODONE-HOMATROPINE 5-1.5 MG/5ML PO SYRP: 5.0000 mL | ORAL_SOLUTION | ORAL | 0 refills | Status: DC | PRN

## 2017-05-09 NOTE — Telephone Encounter (Signed)
Called and spoke with pt. Pt advised the the Zpack and cough syrup medication has been sent in.   Pt was curious to know if she could have an antibiotic called in because she just isn't feeling well at all. Pt is c/o ear pressure, sinus pressure and congestion, along with chest congestion.   Pt stated that she understands if Dr. Clent RidgesFry doesn't feel the antibiotic is needed but the last time she was sick like this the antibiotic really helped her.

## 2017-05-09 NOTE — Telephone Encounter (Signed)
I [facility] in a Zpack [facility]

## 2017-05-09 NOTE — Telephone Encounter (Signed)
Copied from CRM [phone]. Topic: Quick Communication - See Telephone Encounter >> May 09, 2017  9:36 AM Guinevere FerrariMorris, Adeena Bernabe E, NT wrote: CRM for notification. See Telephone encounter for: Patient called and said she was just seen on 05/05/17 for a sinus infection and is not feeling any better and is actually getting worse. Patient said she was prescribed  benzonatate (TESSALON) 100 MG capsule and wanted to see if the doctor could call her something else in to the pharmacy. [id number] [place], [place] - [address] [phone] ([phone])   05/09/17.

## 2017-05-09 NOTE — Telephone Encounter (Signed)
The Zpack is an antibiotic

## 2017-05-09 NOTE — Telephone Encounter (Signed)
Sent to PCP for approval.  

## 2017-05-09 NOTE — Telephone Encounter (Signed)
Called and spoke with pt. Pt advised and voiced understanding.  

## 2017-06-06 ENCOUNTER — Other Ambulatory Visit: Payer: Self-pay | Admitting: Family Medicine

## 2017-06-06 DIAGNOSIS — J069 Acute upper respiratory infection, unspecified: Secondary | ICD-10-CM

## 2017-09-14 ENCOUNTER — Other Ambulatory Visit: Payer: Self-pay | Admitting: Family Medicine

## 2017-09-14 DIAGNOSIS — J069 Acute upper respiratory infection, unspecified: Secondary | ICD-10-CM

## 2017-09-14 NOTE — Telephone Encounter (Signed)
Call in #[phone] with 5 rf 

## 2017-09-14 NOTE — Telephone Encounter (Signed)
Last OV 04/05/2017   Last refilled 04/05/2017 disp 90 with 2 refills   Sent to PCP to advise

## 2017-11-14 DIAGNOSIS — R1030 Lower abdominal pain, unspecified: Secondary | ICD-10-CM | POA: Diagnosis not present

## 2017-11-14 DIAGNOSIS — N760 Acute vaginitis: Secondary | ICD-10-CM | POA: Diagnosis not present

## 2017-12-05 ENCOUNTER — Other Ambulatory Visit: Payer: Self-pay | Admitting: Family Medicine

## 2017-12-05 NOTE — Telephone Encounter (Signed)
Copied from CRM [id number]. Topic: Quick Communication - Rx Refill/Question >> Dec 05, 2017 11:45 AM Raquel SarnaHayes, Teresa G wrote: HYDROcodone-acetaminophen (NORCO/VICODIN) 5-325 MG tablet  Needing a refill  [id number] [place] [place] - [address] [place] [id number] Phone: [phone] Fax: [phone]

## 2017-12-05 NOTE — Telephone Encounter (Signed)
Hydrocodone refill Last Refill:04/05/17 #120 Last OV: 05/05/17 PCP: Dr. Clent RidgesFry [facility] [address]

## 2017-12-06 NOTE — Telephone Encounter (Signed)
Called pt and left a VM to call back to schedule for an appointment.

## 2017-12-06 NOTE — Telephone Encounter (Signed)
Last OV 05/05/2017 with Betty SwazilandJordan   Last refilled 04/05/2017 disp 120 with no refills   Sent to PCP for approval

## 2017-12-06 NOTE — Telephone Encounter (Signed)
She will need a PMV for this  

## 2017-12-22 ENCOUNTER — Ambulatory Visit (INDEPENDENT_AMBULATORY_CARE_PROVIDER_SITE_OTHER): Payer: 59 | Admitting: Family Medicine

## 2017-12-22 ENCOUNTER — Encounter: Payer: Self-pay | Admitting: Family Medicine

## 2017-12-22 VITALS — BP 102/62 | HR 101 | Temp 98.2°F | Ht 65.5 in | Wt 118.0 lb

## 2017-12-22 DIAGNOSIS — Z5181 Encounter for therapeutic drug level monitoring: Secondary | ICD-10-CM | POA: Diagnosis not present

## 2017-12-22 DIAGNOSIS — Z0000 Encounter for general adult medical examination without abnormal findings: Secondary | ICD-10-CM | POA: Diagnosis not present

## 2017-12-22 LAB — POC URINALSYSI DIPSTICK (AUTOMATED)
BILIRUBIN UA: NEGATIVE
Blood, UA: NEGATIVE
GLUCOSE UA: NEGATIVE
KETONES UA: NEGATIVE
Nitrite, UA: NEGATIVE
Protein, UA: NEGATIVE
SPEC GRAV UA: 1.02 (ref 1.010–1.025)
Urobilinogen, UA: 1 E.U./dL
pH, UA: 7 (ref 5.0–8.0)

## 2017-12-22 MED ORDER — OMEPRAZOLE 40 MG PO CPDR: 40.0000 mg | DELAYED_RELEASE_CAPSULE | Freq: Every day | ORAL | 3 refills | Status: DC

## 2017-12-22 MED ORDER — SUMATRIPTAN SUCCINATE 100 MG PO TABS: ORAL_TABLET | ORAL | 3 refills | Status: DC

## 2017-12-22 MED ORDER — FLUOXETINE HCL 40 MG PO CAPS: 40.0000 mg | ORAL_CAPSULE | Freq: Two times a day (BID) | ORAL | 3 refills | Status: DC

## 2017-12-23 LAB — HEPATIC FUNCTION PANEL
ALT: 11 IU/L (ref 0–32)
AST: 15 IU/L (ref 0–40)
Albumin: 4.5 g/dL (ref 3.5–5.5)
Alkaline Phosphatase: 62 IU/L (ref 39–117)
BILIRUBIN, DIRECT: 0.05 mg/dL (ref 0.00–0.40)
Bilirubin Total: <0.2 mg/dL (ref 0.0–1.2)
Total Protein: 6.5 g/dL (ref 6.0–8.5)

## 2017-12-23 LAB — BASIC METABOLIC PANEL
BUN/Creatinine Ratio: 13 (ref 9–23)
BUN: 11 mg/dL (ref 6–24)
CALCIUM: 9.3 mg/dL (ref 8.7–10.2)
CO2: 23 mmol/L (ref 20–29)
Chloride: 103 mmol/L (ref 96–106)
Creatinine, Ser: 0.85 mg/dL (ref 0.57–1.00)
GFR, EST AFRICAN AMERICAN: 90 mL/min/{1.73_m2} (ref >59)
GFR, EST NON AFRICAN AMERICAN: 78 mL/min/{1.73_m2} (ref >59)
Glucose: 80 mg/dL (ref 65–99)
Potassium: 4.2 mmol/L (ref 3.5–5.2)
Sodium: 141 mmol/L (ref 134–144)

## 2017-12-23 LAB — CBC WITH DIFFERENTIAL/PLATELET
BASOS: 1 %
Basophils Absolute: 0 10*3/uL (ref 0.0–0.2)
EOS (ABSOLUTE): 0.2 10*3/uL (ref 0.0–0.4)
Eos: 2 %
HEMATOCRIT: 39.9 % (ref 34.0–46.6)
Hemoglobin: 13.7 g/dL (ref 11.1–15.9)
IMMATURE GRANS (ABS): 0 10*3/uL (ref 0.0–0.1)
Immature Granulocytes: 0 %
LYMPHS: 41 %
Lymphocytes Absolute: 3.4 10*3/uL — ABNORMAL HIGH (ref 0.7–3.1)
MCH: 32.2 pg (ref 26.6–33.0)
MCHC: 34.3 g/dL (ref 31.5–35.7)
MCV: 94 fL (ref 79–97)
MONOCYTES: 6 %
Monocytes Absolute: 0.5 10*3/uL (ref 0.1–0.9)
NEUTROS ABS: 4.1 10*3/uL (ref 1.4–7.0)
Neutrophils: 50 %
Platelets: 289 10*3/uL (ref 150–450)
RBC: 4.25 x10E6/uL (ref 3.77–5.28)
RDW: 13.6 % (ref 12.3–15.4)
WBC: 8.2 10*3/uL (ref 3.4–10.8)

## 2017-12-23 LAB — LIPID PANEL
CHOLESTEROL TOTAL: 209 mg/dL — AB (ref 100–199)
Chol/HDL Ratio: 2.8 ratio (ref 0.0–4.4)
HDL: 76 mg/dL (ref >39)
LDL CALC: 120 mg/dL — AB (ref 0–99)
Triglycerides: 67 mg/dL (ref 0–149)
VLDL Cholesterol Cal: 13 mg/dL (ref 5–40)

## 2017-12-23 LAB — TSH: TSH: 2.17 u[IU]/mL (ref 0.450–4.500)

## 2017-12-25 ENCOUNTER — Encounter: Payer: Self-pay | Admitting: Family Medicine

## 2017-12-25 LAB — PAIN MGMT, PROFILE 8 W/CONF, U
6 Acetylmorphine: NEGATIVE ng/mL (ref <10)
ALCOHOL METABOLITES: POSITIVE ng/mL — AB (ref <500)
ALPHAHYDROXYALPRAZOLAM: 364 ng/mL — AB (ref <25)
ALPHAHYDROXYMIDAZOLAM: NEGATIVE ng/mL (ref <50)
AMPHETAMINES: NEGATIVE ng/mL (ref <500)
Alphahydroxytriazolam: NEGATIVE ng/mL (ref <50)
Aminoclonazepam: NEGATIVE ng/mL (ref <25)
Benzodiazepines: POSITIVE ng/mL — AB (ref <100)
Buprenorphine, Urine: NEGATIVE ng/mL (ref <5)
COCAINE METABOLITE: NEGATIVE ng/mL (ref <150)
CREATININE: 129.6 mg/dL
ETHYL GLUCURONIDE (ETG): 1556 ng/mL — AB (ref <500)
Ethyl Sulfate (ETS): 639 ng/mL — ABNORMAL HIGH (ref <100)
Hydroxyethylflurazepam: NEGATIVE ng/mL (ref <50)
LORAZEPAM: NEGATIVE ng/mL (ref <50)
MDMA: NEGATIVE ng/mL (ref <500)
Marijuana Metabolite: NEGATIVE ng/mL (ref <20)
NORDIAZEPAM: NEGATIVE ng/mL (ref <50)
OXIDANT: NEGATIVE ug/mL (ref <200)
Opiates: NEGATIVE ng/mL (ref <100)
Oxazepam: NEGATIVE ng/mL (ref <50)
Oxycodone: NEGATIVE ng/mL (ref <100)
PH: 7.03 (ref 4.5–9.0)
Temazepam: NEGATIVE ng/mL (ref <50)

## 2017-12-25 NOTE — Progress Notes (Signed)
   Subjective:    Patient ID: Sarah Gay, female    DOB: 1965/11/05, 52 y.o.   MRN: [id number]  HPI Here for a well exam. She is doing well.    Review of Systems  Constitutional: Negative.   HENT: Negative.   Eyes: Negative.   Respiratory: Negative.   Cardiovascular: Negative.   Gastrointestinal: Negative.   Genitourinary: Negative for decreased urine volume, difficulty urinating, dyspareunia, dysuria, enuresis, flank pain, frequency, hematuria, pelvic pain and urgency.  Musculoskeletal: Negative.   Skin: Negative.   Neurological: Negative.   Psychiatric/Behavioral: Negative.        Objective:   Physical Exam  Constitutional: She is oriented to person, place, and time. She appears well-developed and well-nourished. No distress.  HENT:  Head: Normocephalic and atraumatic.  Right Ear: External ear normal.  Left Ear: External ear normal.  Nose: Nose normal.  Mouth/Throat: Oropharynx is clear and moist. No oropharyngeal exudate.  Eyes: Pupils are equal, round, and reactive to light. Conjunctivae and EOM are normal. No scleral icterus.  Neck: Normal range of motion. Neck supple. No JVD present. No thyromegaly present.  Cardiovascular: Normal rate, regular rhythm, normal heart sounds and intact distal pulses. Exam reveals no gallop and no friction rub.  No murmur heard. Pulmonary/Chest: Effort normal and breath sounds normal. No respiratory distress. She has no wheezes. She has no rales. She exhibits no tenderness.  Abdominal: Soft. Bowel sounds are normal. She exhibits no distension and no mass. There is no tenderness. There is no rebound and no guarding.  Musculoskeletal: Normal range of motion. She exhibits no edema or tenderness.  Lymphadenopathy:    She has no cervical adenopathy.  Neurological: She is alert and oriented to person, place, and time. She has normal reflexes. She displays normal reflexes. No cranial nerve deficit. She exhibits normal muscle tone. Coordination  normal.  Skin: Skin is warm and dry. No rash noted. No erythema.  Psychiatric: She has a normal mood and affect. Her behavior is normal. Judgment and thought content normal.          Assessment & Plan:  Well exam. We discussed diet and exercise. Get fasting labs. Gershon Crane, MD

## 2017-12-28 ENCOUNTER — Telehealth: Payer: Self-pay

## 2017-12-28 NOTE — Telephone Encounter (Signed)
Fax from [facility] with pt's lab results   Placed to be scanned into pt's chart.   Per PCP labs were normal however, cholesterol is high advise pt to watch diet.   Called placed and left a VM to call back. [facility]

## 2017-12-29 ENCOUNTER — Ambulatory Visit: Payer: 59 | Admitting: Family Medicine

## 2017-12-29 ENCOUNTER — Encounter: Payer: Self-pay | Admitting: Family Medicine

## 2017-12-29 ENCOUNTER — Telehealth: Payer: Self-pay | Admitting: Family Medicine

## 2017-12-29 ENCOUNTER — Other Ambulatory Visit: Payer: Self-pay | Admitting: Family Medicine

## 2017-12-29 VITALS — BP 98/60 | HR 105 | Temp 98.2°F | Ht 65.5 in | Wt 117.2 lb

## 2017-12-29 DIAGNOSIS — F1190 Opioid use, unspecified, uncomplicated: Secondary | ICD-10-CM

## 2017-12-29 DIAGNOSIS — J069 Acute upper respiratory infection, unspecified: Secondary | ICD-10-CM

## 2017-12-29 DIAGNOSIS — G43801 Other migraine, not intractable, with status migrainosus: Secondary | ICD-10-CM | POA: Diagnosis not present

## 2017-12-29 MED ORDER — HYDROCODONE-ACETAMINOPHEN 5-325 MG PO TABS: 1.0000 | ORAL_TABLET | Freq: Four times a day (QID) | ORAL | 0 refills | Status: DC | PRN

## 2017-12-29 NOTE — Telephone Encounter (Signed)
Called pt and left a VM to call back.  

## 2017-12-29 NOTE — Telephone Encounter (Signed)
Results placed to be scanned into pt's chart

## 2017-12-29 NOTE — Telephone Encounter (Signed)
Copied from CRM [id number]. Topic: Quick Communication - Lab Results >> Dec 29, 2017 12:13 PM Starla Linkakley, Carolyn J, RN wrote: Jeanene Erballed patient to inform them of lab results. When patient returns call, triage nurse may disclose results.

## 2017-12-29 NOTE — Telephone Encounter (Signed)
Returned call to pt .  See result note.  

## 2017-12-29 NOTE — Progress Notes (Signed)
   Subjective:    Patient ID: Artis FlockDonna G Giller, female    DOB: April 22, 1965, 52 y.o.   MRN: [id number]  HPI Here for her first pain management visit. She takes frequent pain meds for chronic migraines and this works well for her.  Indication for chronic opioid: migraines  Medication and dose: Norco 5-325 # pills per month: 120 Last UDS date: 12-22-17 Opioid Treatment Agreement signed (Y/N): 12-29-17 Opioid Treatment Agreement last reviewed with patient:   12-29-17                                                                         NCCSRS reviewed this encounter (include red flags):  12-29-17    Review of Systems  Constitutional: Negative.   Respiratory: Negative.   Cardiovascular: Negative.   Neurological: Positive for headaches.       Objective:   Physical Exam  Constitutional: She is oriented to person, place, and time. She appears well-developed and well-nourished.  Cardiovascular: Normal rate, regular rhythm, normal heart sounds and intact distal pulses.  Pulmonary/Chest: Effort normal and breath sounds normal.  Neurological: She is alert and oriented to person, place, and time.          Assessment & Plan:  Pain management, meds were refilled.  Gershon CraneStephen Lacee Grey, MD

## 2018-01-09 NOTE — Telephone Encounter (Signed)
Patient is calling to  Advise the rx is needing a Pre authorization.  Patient is requesting a call back.

## 2018-01-18 DIAGNOSIS — Z803 Family history of malignant neoplasm of breast: Secondary | ICD-10-CM | POA: Diagnosis not present

## 2018-01-18 DIAGNOSIS — Z1231 Encounter for screening mammogram for malignant neoplasm of breast: Secondary | ICD-10-CM | POA: Diagnosis not present

## 2018-01-25 DIAGNOSIS — Z124 Encounter for screening for malignant neoplasm of cervix: Secondary | ICD-10-CM | POA: Diagnosis not present

## 2018-01-25 DIAGNOSIS — Z01419 Encounter for gynecological examination (general) (routine) without abnormal findings: Secondary | ICD-10-CM | POA: Diagnosis not present

## 2018-02-08 ENCOUNTER — Telehealth: Payer: Self-pay | Admitting: Family Medicine

## 2018-02-08 NOTE — Telephone Encounter (Signed)
Copied from CRM [id number]. Topic: Quick Communication - Rx Refill/Question >> Feb 08, 2018  4:58 PM Jens Som A wrote: Medication: HYDROcodone-acetaminophen (NORCO/VICODIN) 5-325 MG tablet [[id number]]   Has the patient contacted their pharmacy? Yes  (Agent: If no, request that the patient contact the pharmacy for the refill.) (Agent: If yes, when and what did the pharmacy advise?)  Preferred Pharmacy (with phone number or street name): [id number] [place], [place] - [address] [place] [address] Phone: [phone] Fax: [phone]    Agent: Please be advised that RX refills may take up to 3 business days. We ask that you follow-up with your pharmacy.

## 2018-02-12 NOTE — Telephone Encounter (Signed)
Refills of this medication were sent in to her pharmacy for the next 3 months.  She will need to contact the pharmacy for the refill.  I have called and lmom to have the pt call back.

## 2018-02-16 MED ORDER — HYDROCODONE-ACETAMINOPHEN 5-325 MG PO TABS: 1.0000 | ORAL_TABLET | Freq: Four times a day (QID) | ORAL | 0 refills | Status: AC | PRN

## 2018-02-16 NOTE — Telephone Encounter (Signed)
Called and spoke with the pharmacy and they stated that since the pt is new to therapy---the insurance will only cover a 7 day supply to start and then they will probably cover a monthly rx.    The pharmacy stated that they would ask Dr. Clent Ridges to send in a 7 day supply to start and then they will need a new rx as the other rx have 2 directions on them.  Dr. Clent Ridges please advise thanks

## 2018-02-16 NOTE — Telephone Encounter (Signed)
Done for 7 days  

## 2018-02-16 NOTE — Telephone Encounter (Signed)
I have called and lmom for the pt to make her aware that her insurance will require her to do the 7 day rx for the pain medication and then we can send in the 30 day supply after this.  This has been sent in by Dr. Clent Ridges and I have called the pharmacy to have the other rx discarded.

## 2018-02-19 NOTE — Telephone Encounter (Signed)
Copied from CRM [id number]. Topic: General - Other >> Feb 19, 2018 11:36 AM Marylen Ponto wrote: Reason for CRM: Pt called in requesting to speak with Nedra Hai. Pt requests a call back. Cb# [phone]

## 2018-02-19 NOTE — Telephone Encounter (Signed)
I have called and lmom for the pt to call me back.

## 2018-02-20 NOTE — Telephone Encounter (Signed)
Patient returning missed call from [facility].

## 2018-02-20 NOTE — Telephone Encounter (Signed)
Patient is also requesting a prior approval stating that her migraines are preexisting.  She would like a return call from [facility].  She is willing to make an appointment if needed.

## 2018-02-20 NOTE — Telephone Encounter (Signed)
Called and lmom for the pt to call back and discuss her medication.

## 2018-02-20 NOTE — Telephone Encounter (Signed)
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  The pain management lab was sent to [facility].  She now has a bill for $108.  She would like this resolved if possible.

## 2018-03-05 NOTE — Telephone Encounter (Signed)
lmomtcb for the pt.  

## 2018-03-26 DIAGNOSIS — L820 Inflamed seborrheic keratosis: Secondary | ICD-10-CM | POA: Diagnosis not present

## 2018-03-26 DIAGNOSIS — L821 Other seborrheic keratosis: Secondary | ICD-10-CM | POA: Diagnosis not present

## 2018-03-26 DIAGNOSIS — L819 Disorder of pigmentation, unspecified: Secondary | ICD-10-CM | POA: Diagnosis not present

## 2018-03-26 DIAGNOSIS — L814 Other melanin hyperpigmentation: Secondary | ICD-10-CM | POA: Diagnosis not present

## 2018-03-29 ENCOUNTER — Other Ambulatory Visit: Payer: Self-pay | Admitting: Family Medicine

## 2018-04-02 NOTE — Telephone Encounter (Signed)
Dr. Clent RidgesFry please advise on the refills. Thanks

## 2018-04-03 NOTE — Telephone Encounter (Signed)
Call in #[phone] with 5 rf 

## 2018-04-03 NOTE — Telephone Encounter (Signed)
Refill called to the pharmacy 

## 2018-04-13 ENCOUNTER — Telehealth: Payer: Self-pay | Admitting: Family Medicine

## 2018-04-13 NOTE — Telephone Encounter (Signed)
Copied from CRM [id number]. Topic: Quick Communication - See Telephone Encounter >> Apr 13, 2018  2:37 PM [name], [name], NT wrote: CRM for notification. See Telephone encounter for: 04/13/18.  Patient is calling and requesting a refill on HYDROcodone-acetaminophen (NORCO/VICODIN) 5-325 MG tablet. It is no longer on her file, she states she has been on this for awhile.  [id number] [place], [place] - [address] [place] [id number] Phone: [phone] Fax: [phone]

## 2018-04-16 NOTE — Telephone Encounter (Signed)
Pt is due for PMV visit

## 2018-04-17 NOTE — Telephone Encounter (Signed)
Called and spoke with pt and she is aware of PMV appt on Friday at 3.

## 2018-04-20 ENCOUNTER — Ambulatory Visit: Payer: 59 | Admitting: Family Medicine

## 2018-04-20 ENCOUNTER — Encounter: Payer: Self-pay | Admitting: Family Medicine

## 2018-04-20 VITALS — BP 108/60 | HR 83 | Temp 98.4°F | Wt 123.2 lb

## 2018-04-20 DIAGNOSIS — F1190 Opioid use, unspecified, uncomplicated: Secondary | ICD-10-CM | POA: Diagnosis not present

## 2018-04-20 DIAGNOSIS — G43801 Other migraine, not intractable, with status migrainosus: Secondary | ICD-10-CM

## 2018-04-20 MED ORDER — HYDROCODONE-ACETAMINOPHEN 5-325 MG PO TABS: 1.0000 | ORAL_TABLET | Freq: Four times a day (QID) | ORAL | 0 refills | Status: DC | PRN

## 2018-04-20 MED ORDER — AZITHROMYCIN 250 MG PO TABS: ORAL_TABLET | ORAL | 0 refills | Status: DC

## 2018-04-20 MED ORDER — HYDROCODONE-ACETAMINOPHEN 5-325 MG PO TABS: 1.0000 | ORAL_TABLET | Freq: Four times a day (QID) | ORAL | 0 refills | Status: AC | PRN

## 2018-04-20 NOTE — Progress Notes (Signed)
   Subjective:    Patient ID: Sarah Gay, female    DOB: 1965/11/28, 53 y.o.   MRN: [id number]  HPI Here for pain management, but she also has 5 days of sinus pressure, PND, and a dry cough. No fever. Her migraines have been stable.  Indication for chronic opioid: migraines  Medication and dose: Norco 5-325  # pills per month: 120 Last UDS date: 12-22-17 Opioid Treatment Agreement signed (Y/N): 12-29-17 Opioid Treatment Agreement last reviewed with patient:  04-20-18 NCCSRS reviewed this encounter (include red flags):  04-20-18    Review of Systems  Constitutional: Negative.   HENT: Positive for congestion, postnasal drip, sinus pressure and sinus pain. Negative for sore throat.   Eyes: Negative.   Respiratory: Positive for cough.   Cardiovascular: Negative.   Neurological: Positive for headaches.       Objective:   Physical Exam Constitutional:      Appearance: Normal appearance.  HENT:     Right Ear: Tympanic membrane and ear canal normal.     Left Ear: Tympanic membrane and ear canal normal.     Nose: Nose normal.     Mouth/Throat:     Pharynx: Oropharynx is clear.  Eyes:     Conjunctiva/sclera: Conjunctivae normal.  Neck:     Musculoskeletal: Normal range of motion.  Pulmonary:     Effort: Pulmonary effort is normal.     Breath sounds: Normal breath sounds.  Lymphadenopathy:     Cervical: No cervical adenopathy.  Neurological:     General: No focal deficit present.     Mental Status: She is alert and oriented to person, place, and time.           Assessment & Plan:  Pain management, meds were refilled. Treat the sinusitis with a Zpack.  Gershon Crane, MD

## 2018-04-23 NOTE — Telephone Encounter (Signed)
Pt calling Sarah Gay back stating that she wanted to share of information to her about the Hydrocodone she would like a call back before Sarah Gay respond back to pharmacy it'll make it easier for her please call [phone]

## 2018-04-23 NOTE — Telephone Encounter (Signed)
Called and spoke with Sarah Gay and she stated that the pharmacists spoke with her about the hydrocodone rx.  She stated that with this being the new year and with new insurance---if Dr. Clent Ridges can send in rx for the 7 day hydrocodone rx---they will run this next week and then fill the full hydrocodone rx the next time.  Hopefully the insurance will cover the rx that way.  If not, if will need a PA done.  Dr. Clent Ridges please advise. Thanks

## 2018-04-24 MED ORDER — HYDROCODONE-ACETAMINOPHEN 5-325 MG PO TABS: 1.0000 | ORAL_TABLET | Freq: Four times a day (QID) | ORAL | 0 refills | Status: DC | PRN

## 2018-04-24 NOTE — Telephone Encounter (Signed)
I sent in for #28

## 2018-04-24 NOTE — Addendum Note (Signed)
Addended by: Gershon Crane A on: 04/24/2018 08:49 AM   Modules accepted: Orders

## 2018-04-30 DIAGNOSIS — L820 Inflamed seborrheic keratosis: Secondary | ICD-10-CM | POA: Diagnosis not present

## 2018-05-22 ENCOUNTER — Telehealth: Payer: Self-pay | Admitting: Family Medicine

## 2018-05-22 ENCOUNTER — Telehealth: Payer: Self-pay

## 2018-05-22 NOTE — Telephone Encounter (Signed)
PA for HYDROcodone-acetaminophen (NORCO) 5-325 MG tablet sent to cover my meds.  Key: [id number] - PA Case ID: [id number]

## 2018-05-22 NOTE — Telephone Encounter (Signed)
Copied from CRM [id number]. Topic: General - Other >> May 22, 2018 10:28 AM Tamela Oddi wrote: Reason for CRM: Patient called to speak with the doctor or his nurse to request a PA for her medication, HYDROcodone-acetaminophen (NORCO) 5-325 MG tablet.  Please call patient with any questions.  CB# [phone]

## 2018-05-22 NOTE — Telephone Encounter (Signed)
Copied from CRM #[id number]. Topic: General - Other >> May 22, 2018 10:28 AM Gay, Sarah J wrote: Reason for CRM: Patient called to speak with the doctor or his nurse to request a PA for her medication, HYDROcodone-acetaminophen (NORCO) 5-325 MG tablet.  Please call patient with any questions.  CB# [phone] 

## 2018-05-22 NOTE — Telephone Encounter (Signed)
See other phone note from 05/22/2018 for further documentation.

## 2018-05-23 NOTE — Telephone Encounter (Signed)
P. A. Has been approved.

## 2018-08-29 ENCOUNTER — Telehealth: Payer: Self-pay | Admitting: *Deleted

## 2018-08-29 NOTE — Telephone Encounter (Signed)
Dr. Fry please advise. Thanks  

## 2018-08-29 NOTE — Telephone Encounter (Signed)
I have called the pt and left a VM to have her call back to set up virtual visit to discuss testing with D.r Clent Ridges.

## 2018-08-29 NOTE — Telephone Encounter (Signed)
Pt called back and she stated that [employer] where she works is offering the antibody covid testing for free.  She is needing an order sent in to have this done.  She was advise by the [facility] that she would need a virtual visit with Dr. Clent Ridges.  She stated to me that she did not want to take up Dr. Claris Che time and would just like the order sent in for this. I advise her that she would still need the visit in order for Dr. Clent Ridges to order this test.  She requested that I ask Dr. Clent Ridges again and get back with her.    I spoke with Dr. Clent Ridges and he did in fact state that she would need an appt---virtual visit, even though she is not having any symptoms in order for him to order the test.

## 2018-08-29 NOTE — Telephone Encounter (Signed)
Set up a virtual visit to discuss this  

## 2018-08-29 NOTE — Telephone Encounter (Signed)
Copied from CRM [id number]. Topic: General - Inquiry >> Aug 29, 2018 12:34 PM Randol Kern wrote: Reason for CRM: Pt called requesting approval for Covid 19 IGG Antibody Test at [facility]. Requesting for approval to be sent to [facility] and also emailed to her as well if possible.  Please advise.  Best Contact: [phone]

## 2018-08-30 NOTE — Telephone Encounter (Signed)
Called and spoke with pt and she is aware and will call back if she wants to schedule the visit

## 2018-09-17 ENCOUNTER — Ambulatory Visit: Payer: Self-pay | Admitting: *Deleted

## 2018-09-17 NOTE — Telephone Encounter (Signed)
Patient's daughter tested positive 4-5 days ago. Patient was with her for entire day in the home and in car on 09/10/18 not practicing social distance, not wearing mask. Patient denies fever/SOB/body aches/congestion-any and all symptoms. 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 Reviewed CDC guidelines with her including isolation/mask/monitoring temperature. Told to call back with any signs of symptoms. Stated she understood but would like testing due to her employment. She has been taking all precautions since late last week. Please advise regarding testing. Thank you. Reason for Disposition . [1] COVID-19 EXPOSURE (Close Contact) within last 14 days AND [2] NO cough, fever, or breathing difficulty . [1] COVID-19 EXPOSURE within last 14 days AND [2] NO cough, fever, or breathing difficulty AND [3] exposed person is a Research scientist (physical sciences) who was NOT using all recommended personal protective equipment (i.e., a respirator-N95 mask, eye protection, gloves, and gown)  Answer Assessment - Initial Assessment Questions 1. CLOSE CONTACT: "Who is the person with the confirmed or suspected COVID-19 infection that you were exposed to?"     Patient's daughter 2. PLACE of CONTACT: "Where were you when you were exposed to COVID-19?" (e.g., home, school, medical waiting room; which city?)     One week ago today, 09/10/18 3. TYPE of CONTACT: "How much contact was there?" (e.g., sitting next to, live in same house, work in same office, same building)     Same home same vehicle, sitting around visiting without mask and social distancing. 4. DURATION of CONTACT: "How long were you in contact with the COVID-19 patient?" (e.g., a few seconds, passed by person, a few minutes, live with the patient)     All day 5. DATE of CONTACT: "When did you have contact with a COVID-19 patient?" (e.g., how many days ago)     7 days ago 6. TRAVEL: "Have you traveled out of the country recently?" If so, "When and  where?"no     * Also ask about out-of-state travel, since the CDC has identified some high risk cities for community spread in the [place].     * Note: Travel becomes less relevant if there is widespread community transmission where the patient lives.  7. COMMUNITY SPREAD: "Are there lots of cases of COVID-19 (community spread) where you live?" (See public health department website, if unsure)   * MAJOR community spread: high number of cases; numbers of cases are increasing; many people hospitalized.   * MINOR community spread: low number of cases; not increasing; few or no people hospitalized     8. SYMPTOMS: "Do you have any symptoms?" (e.g., fever, cough, breathing difficulty)     Denies all symptoms 9. PREGNANCY OR POSTPARTUM: "Is there any chance you are pregnant?" "When was your last menstrual period?" "Did you deliver in the last 2 weeks?"     no 10. HIGH RISK: "Do you have any heart or lung problems? Do you have a weak immune system?" (e.g., CHF, COPD, asthma, HIV positive, chemotherapy, renal failure, diabetes mellitus, sickle cell anemia)       no  Protocols used: CORONAVIRUS (COVID-19) EXPOSURE-A-AH

## 2018-09-17 NOTE — Telephone Encounter (Signed)
Dr. Fry please advise. Thanks  

## 2018-09-18 ENCOUNTER — Telehealth: Payer: Self-pay

## 2018-09-18 NOTE — Telephone Encounter (Signed)
Dr. Clent Ridges request COVID 19 test. Left message for pt. To call back and schedule testing.

## 2018-09-18 NOTE — Telephone Encounter (Signed)
referall sent over to the [facility] for testing.

## 2018-09-18 NOTE — Telephone Encounter (Signed)
Please refer her for Covid-19 testing

## 2018-10-23 ENCOUNTER — Other Ambulatory Visit: Payer: Self-pay | Admitting: Family Medicine

## 2018-10-23 ENCOUNTER — Telehealth: Payer: Self-pay | Admitting: Family Medicine

## 2018-10-23 NOTE — Telephone Encounter (Signed)
Patient requesting HYDROcodone-acetaminophen ([place]) 5-325 MG tablet, informed patient please allow 72 hour turn around time  [id number] [place], [place] [phone] (Phone) [phone] (Fax)

## 2018-10-23 NOTE — Telephone Encounter (Signed)
Patient scheduled for virtual PMV for 10/24/2018 at 2:15 PM

## 2018-10-23 NOTE — Telephone Encounter (Signed)
Pt will need PMV

## 2018-10-24 ENCOUNTER — Other Ambulatory Visit: Payer: Self-pay

## 2018-10-24 ENCOUNTER — Encounter: Payer: Self-pay | Admitting: Family Medicine

## 2018-10-24 ENCOUNTER — Telehealth: Payer: Self-pay | Admitting: Family Medicine

## 2018-10-24 ENCOUNTER — Ambulatory Visit (INDEPENDENT_AMBULATORY_CARE_PROVIDER_SITE_OTHER): Payer: 59 | Admitting: Family Medicine

## 2018-10-24 DIAGNOSIS — G43801 Other migraine, not intractable, with status migrainosus: Secondary | ICD-10-CM | POA: Diagnosis not present

## 2018-10-24 DIAGNOSIS — F1190 Opioid use, unspecified, uncomplicated: Secondary | ICD-10-CM | POA: Diagnosis not present

## 2018-10-24 MED ORDER — ALPRAZOLAM 0.5 MG PO TABS: 0.5000 mg | ORAL_TABLET | Freq: Three times a day (TID) | ORAL | 5 refills | Status: DC | PRN

## 2018-10-24 MED ORDER — HYDROCODONE-ACETAMINOPHEN 5-325 MG PO TABS: 1.0000 | ORAL_TABLET | Freq: Four times a day (QID) | ORAL | 0 refills | Status: DC | PRN

## 2018-10-24 MED ORDER — HYDROCODONE-ACETAMINOPHEN 5-325 MG PO TABS: 1.0000 | ORAL_TABLET | Freq: Four times a day (QID) | ORAL | 0 refills | Status: AC | PRN

## 2018-10-24 NOTE — Telephone Encounter (Signed)
Medication Refill - Medication: ALPRAZolam (XANAX) 0.5 MG tablet (Pharmacy called and stated that patients insurance will not allow this medication amount starting off. Inurance will only allow 7 day supply. Pharmacy would like a callback on what Dr. Sarajane Jews advises.)   Has the patient contacted their pharmacy? Yes (Agent: If no, request that the patient contact the pharmacy for the refill.) (Agent: If yes, when and what did the pharmacy advise?)Contact Pcp  Preferred Pharmacy (with phone number or street name):  [id number] [place], [place] - [place] [phone] (Phone) [phone] (Fax)     Agent: Please be advised that RX refills may take up to 3 business days. We ask that you follow-up with your pharmacy.

## 2018-10-24 NOTE — Progress Notes (Signed)
Subjective:    Patient ID: Sarah Gay, female    DOB: 01/03/1966, 53 y.o.   MRN: [id number]  HPI Virtual Visit via Video Note  I connected with the patient on 10/24/18 at  2:15 PM EDT by a video enabled telemedicine application and verified that I am speaking with the correct person using two identifiers.  Location patient: home Location provider:work or home office Persons participating in the virtual visit: patient, provider  I discussed the limitations of evaluation and management by telemedicine and the availability of in person appointments. The patient expressed understanding and agreed to proceed.   HPI: Here for pain management. She is doing well.  Indication for chronic opioid: migraines Medication and dose: Norco 5-325 # pills per month: 120 Last UDS date: 12-22-17 Opioid Treatment Agreement signed (Y/N): 12-29-17 Opioid Treatment Agreement last reviewed with patient:  10-24-18 NCCSRS reviewed this encounter (include red flags):  10-24-18    ROS: See pertinent positives and negatives per HPI.  Past Medical History:  Diagnosis Date   Asthma    Depression    GERD (gastroesophageal reflux disease)    Migraines    Mononucleosis    as a child     Past Surgical History:  Procedure Laterality Date   BREAST SURGERY     bilateral augmentations   COLONOSCOPY  03/18/2016   per Dr. Ardis Hughs, clear, repeat in 10 yrs    SPINE SURGERY     repair C7 cervical disc     Family History  Problem Relation Age of Onset   Breast cancer Mother    Heart disease Father    Colon cancer Neg Hx      Current Outpatient Medications:    ALPRAZolam (XANAX) 0.5 MG tablet, Take 1 tablet (0.5 mg total) by mouth 3 (three) times daily as needed., Disp: 90 tablet, Rfl: 5   FLUoxetine (PROZAC) 40 MG capsule, Take 1 capsule (40 mg total) by mouth 2 (two) times daily., Disp: 180 capsule, Rfl: 3   [START ON 12/25/2018] HYDROcodone-acetaminophen (NORCO) 5-325 MG tablet, Take 1 tablet  by mouth every 6 (six) hours as needed for moderate pain., Disp: 120 tablet, Rfl: 0   Melatonin 5 MG TABS, Take 1 each by mouth at bedtime.  , Disp: , Rfl:    omeprazole (PRILOSEC) 40 MG capsule, Take 1 capsule (40 mg total) by mouth daily., Disp: 90 capsule, Rfl: 3   SUMAtriptan (IMITREX) 100 MG tablet, TAKE 1 TABLET BY MOUTH AS DIRECTED AS NEEDED FOR MIGRAINES, Disp: 27 tablet, Rfl: 3   fluticasone (FLONASE) 50 MCG/ACT nasal spray, PLACE 1 SPRAY INTO BOTH NOSTRILS 2 (TWO) TIMES DAILY, Disp: 16 g, Rfl: 3  Current Facility-Administered Medications:    0.9 %  sodium chloride infusion, 500 mL, Intravenous, Continuous, Milus Banister, MD  EXAM:  VITALS per patient if applicable:  GENERAL: alert, oriented, appears well and in no acute distress  HEENT: atraumatic, conjunttiva clear, no obvious abnormalities on inspection of external nose and ears  NECK: normal movements of the head and neck  LUNGS: on inspection no signs of respiratory distress, breathing rate appears normal, no obvious gross SOB, gasping or wheezing  CV: no obvious cyanosis  MS: moves all visible extremities without noticeable abnormality  PSYCH/NEURO: pleasant and cooperative, no obvious depression or anxiety, speech and thought processing grossly intact  ASSESSMENT AND PLAN:  Discussed the following assessment and plan:  No diagnosis found.     I discussed the assessment and treatment plan with  the patient. The patient was provided an opportunity to ask questions and all were answered. The patient agreed with the plan and demonstrated an understanding of the instructions.   The patient was advised to call back or seek an in-person evaluation if the symptoms worsen or if the condition fails to improve as anticipated.     Review of Systems     Objective:   Physical Exam        Assessment & Plan:  Pain management, meds were refilled.  Gershon CraneStephen Mattelyn Imhoff, MD

## 2018-10-25 NOTE — Telephone Encounter (Signed)
Dr. Fry please advise. Thanks  

## 2018-10-25 NOTE — Telephone Encounter (Signed)
I refilled this yesterday

## 2018-11-27 ENCOUNTER — Telehealth: Payer: Self-pay | Admitting: Family Medicine

## 2018-11-27 NOTE — Telephone Encounter (Signed)
See note

## 2018-11-27 NOTE — Telephone Encounter (Signed)
Medication Refill - Medication: something for poison ivy Pt got poison ivy over the weekend and otc remedies are not helping.  Pt thinks she needs a steroid.  She is out of town    Has the patient contacted their pharmacy? No. (Agent: If no, request that the patient contact the pharmacy for the refill.) (Agent: If yes, when and what did the pharmacy advise?)  Preferred Pharmacy (with phone number or street name): [place] [facility]    Agent: Please be advised that RX refills may take up to 3 business days. We ask that you follow-up with your pharmacy.

## 2018-11-27 NOTE — Telephone Encounter (Signed)
Called pt and tried to schedule appt. Pt stated she was on the other line with work and will call back later.

## 2018-11-27 NOTE — Telephone Encounter (Signed)
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 I asked if she would like for me to schedule a VV 2:15 with Sarajane Jews pt declined. Pt stated a 1 pm will be better. As I was looking for a 1pm with another provider pt stated she will go to the pharmacy and find some cream. Pt declined a visit and stated " I will seee how this does and call back if not better". No further action needed!

## 2018-12-14 ENCOUNTER — Telehealth: Payer: Self-pay | Admitting: Family Medicine

## 2018-12-14 NOTE — Telephone Encounter (Signed)
Copied from [place] [id number]. Topic: Quick Communication - Rx Refill/Question >> Dec 14, 2018  4:53 PM Rainey Pines A wrote: Medication: HYDROcodone-acetaminophen (NORCO) 5-325 MG tablet( Patient stated that pharmacy needs pre authorization for medication and has sent request today. Patient would like a callback once its been sent to pharmacy.)  Has the patient contacted their pharmacy? {Yes (Agent: If no, request that the patient contact the pharmacy for the refill.) (Agent: If yes, when and what did the pharmacy advise?)Contact Pcp  Preferred Pharmacy (with phone number or street name): [id number] [place], [place] - [place] [phone] (Phone) [phone] (Fax)    Agent: Please be advised that RX refills may take up to 3 business days. We ask that you follow-up with your pharmacy.

## 2018-12-17 NOTE — Telephone Encounter (Signed)
Please do the PA  

## 2018-12-18 NOTE — Telephone Encounter (Signed)
PA has been sent to cover my meds.   KeyConsuela Mimes - PA Case ID: [id number]

## 2018-12-19 NOTE — Telephone Encounter (Signed)
PA has been approved

## 2018-12-19 NOTE — Telephone Encounter (Signed)
Left a detailed message on verified voice mail.  Nothing further needed. 

## 2019-01-16 ENCOUNTER — Other Ambulatory Visit: Payer: Self-pay | Admitting: Family Medicine

## 2019-01-17 NOTE — Telephone Encounter (Signed)
Okay for refill? Please advise 

## 2019-04-24 ENCOUNTER — Other Ambulatory Visit: Payer: Self-pay | Admitting: Family Medicine

## 2019-04-24 NOTE — Telephone Encounter (Signed)
Last fill 10/24/2018 Last OV 10/24/2018 Ok to fill?

## 2019-07-12 ENCOUNTER — Telehealth: Payer: Self-pay | Admitting: Family Medicine

## 2019-07-12 NOTE — Telephone Encounter (Signed)
Medication:Hydracodone [facility] [place]

## 2019-07-12 NOTE — Telephone Encounter (Signed)
Refill will be done at [facility] on 07/17/2019.

## 2019-07-17 ENCOUNTER — Encounter: Payer: Self-pay | Admitting: Family Medicine

## 2019-07-17 ENCOUNTER — Telehealth (INDEPENDENT_AMBULATORY_CARE_PROVIDER_SITE_OTHER): Payer: 59 | Admitting: Family Medicine

## 2019-07-17 DIAGNOSIS — F1190 Opioid use, unspecified, uncomplicated: Secondary | ICD-10-CM | POA: Diagnosis not present

## 2019-07-17 DIAGNOSIS — G43801 Other migraine, not intractable, with status migrainosus: Secondary | ICD-10-CM

## 2019-07-17 MED ORDER — HYDROCODONE-ACETAMINOPHEN 5-325 MG PO TABS: 1.0000 | ORAL_TABLET | Freq: Four times a day (QID) | ORAL | 0 refills | Status: DC | PRN

## 2019-07-17 MED ORDER — OMEPRAZOLE 40 MG PO CPDR: 40.0000 mg | DELAYED_RELEASE_CAPSULE | Freq: Every day | ORAL | 3 refills | Status: DC

## 2019-07-17 MED ORDER — FLUOXETINE HCL 40 MG PO CAPS: 40.0000 mg | ORAL_CAPSULE | Freq: Two times a day (BID) | ORAL | 3 refills | Status: DC

## 2019-07-17 MED ORDER — HYDROCODONE-ACETAMINOPHEN 5-325 MG PO TABS: 1.0000 | ORAL_TABLET | Freq: Four times a day (QID) | ORAL | 0 refills | Status: AC | PRN

## 2019-07-17 NOTE — Progress Notes (Signed)
   Subjective:    Patient ID: Sarah Gay, female    DOB: [birth year], 54 y.o.   MRN: [id number]  HPI Virtual Visit via Telephone Note  I connected with the patient on 07/17/19 at  2:00 PM EDT by telephone and verified that I am speaking with the correct person using two identifiers.   I discussed the limitations, risks, security and privacy concerns of performing an evaluation and management service by telephone and the availability of in person appointments. I also discussed with the patient that there may be a patient responsible charge related to this service. The patient expressed understanding and agreed to proceed.  Location patient: home Location provider: work or home office Participants present for the call: patient, provider Patient did not have a visit in the prior 7 days to address this/these issue(s).   History of Present Illness: Here for pain management, she is doing well.  Indication for chronic opioid: migraines Medication and dose: Norco 5-325 # pills per month: 120 Last UDS date: 12-22-17 Opioid Treatment Agreement signed (Y/N): 12-29-17 Opioid Treatment Agreement last reviewed with patient:  07-17-19 NCCSRS reviewed this encounter (include red flags): Yes    Observations/Objective: Patient sounds cheerful and well on the phone. I do not appreciate any SOB. Speech and thought processing are grossly intact. Patient reported vitals:  Assessment and Plan: Pain management, meds were refilled.  Gershon Crane, MD   Follow Up Instructions:     [id number] [phone] 11-20 [id number] 21-30 I did not refer this patient for an OV in the next 24 hours for this/these issue(s).  I discussed the assessment and treatment plan with the patient. The patient was provided an opportunity to ask questions and all were answered. The patient agreed with the plan and demonstrated an understanding of the instructions.   The patient was advised to call back or seek an in-person evaluation  if the symptoms worsen or if the condition fails to improve as anticipated.  I provided 10 minutes of non-face-to-face time during this encounter.   Gershon Crane, MD    Review of Systems     Objective:   Physical Exam        Assessment & Plan:

## 2019-07-30 ENCOUNTER — Other Ambulatory Visit: Payer: 59

## 2019-07-30 ENCOUNTER — Telehealth: Payer: Self-pay | Admitting: Family Medicine

## 2019-07-30 ENCOUNTER — Other Ambulatory Visit: Payer: Self-pay

## 2019-07-30 DIAGNOSIS — Z5181 Encounter for therapeutic drug level monitoring: Secondary | ICD-10-CM

## 2019-07-30 NOTE — Telephone Encounter (Signed)
Noted. Left message for patient to call back.

## 2019-07-30 NOTE — Telephone Encounter (Signed)
This was a UDS and it has already been collected

## 2019-07-30 NOTE — Telephone Encounter (Signed)
The patient is coming in this after noon for labs but there is no orders in the system for labs. She called to schedule and said that Dr. Clent Ridges wanted her to come in to have labs done from her last visit.  Please advise

## 2019-07-30 NOTE — Telephone Encounter (Signed)
Please advise. What labs does the patient needing?

## 2019-08-01 LAB — PMP SCREEN PROFILE (10S), URINE
Amphetamine Scrn, Ur: NEGATIVE ng/mL
BARBITURATE SCREEN URINE: NEGATIVE ng/mL
BENZODIAZEPINE SCREEN, URINE: POSITIVE ng/mL — AB
CANNABINOIDS UR QL SCN: NEGATIVE ng/mL
Cocaine (Metab) Scrn, Ur: NEGATIVE ng/mL
Creatinine(Crt), U: 72 mg/dL (ref 20.0–300.0)
Methadone Screen, Urine: NEGATIVE ng/mL
OXYCODONE+OXYMORPHONE UR QL SCN: NEGATIVE ng/mL
Opiate Scrn, Ur: NEGATIVE ng/mL
Ph of Urine: 5.9 (ref 4.5–8.9)
Phencyclidine Qn, Ur: NEGATIVE ng/mL
Propoxyphene Scrn, Ur: NEGATIVE ng/mL

## 2019-08-02 ENCOUNTER — Telehealth: Payer: Self-pay | Admitting: Family Medicine

## 2019-08-02 NOTE — Telephone Encounter (Signed)
Pt spoke to her pharmacy and they stated Insurance is stating she can only pick up a 7 day supply of the Hydrocodone.   Medication Refill: Hydrocodone  [facility] [address]  FAX: [phone]   Pt states that a PA may need to be sent and she has some right now so there is no rush.   Pt can be reached at [phone]

## 2019-08-06 NOTE — Telephone Encounter (Signed)
Patient is aware 

## 2019-08-06 NOTE — Telephone Encounter (Signed)
Sarah Gay (Key: [id number]) PA sent to cover my meds.

## 2019-08-06 NOTE — Telephone Encounter (Signed)
Request Reference Number: [id number]. HYDROCO/APAP TAB 5-325MG  is approved through 02/05/2020. Your patient may now fill this prescription and it will be covered.

## 2019-11-15 ENCOUNTER — Other Ambulatory Visit: Payer: Self-pay | Admitting: Family Medicine

## 2019-11-15 NOTE — Telephone Encounter (Signed)
Last filled 04/29/2019 Last OV 07/17/2019  Ok to fill?

## 2020-01-15 ENCOUNTER — Ambulatory Visit (INDEPENDENT_AMBULATORY_CARE_PROVIDER_SITE_OTHER): Payer: 59 | Admitting: Family Medicine

## 2020-01-15 ENCOUNTER — Encounter: Payer: Self-pay | Admitting: Family Medicine

## 2020-01-15 ENCOUNTER — Other Ambulatory Visit: Payer: Self-pay

## 2020-01-15 VITALS — BP 110/70 | HR 87 | Temp 98.4°F | Ht 66.0 in | Wt 129.4 lb

## 2020-01-15 DIAGNOSIS — Z23 Encounter for immunization: Secondary | ICD-10-CM

## 2020-01-15 DIAGNOSIS — R7989 Other specified abnormal findings of blood chemistry: Secondary | ICD-10-CM

## 2020-01-15 DIAGNOSIS — Z0000 Encounter for general adult medical examination without abnormal findings: Secondary | ICD-10-CM

## 2020-01-15 MED ORDER — SUMATRIPTAN SUCCINATE 100 MG PO TABS: ORAL_TABLET | ORAL | 11 refills | Status: DC

## 2020-01-15 NOTE — Addendum Note (Signed)
Addended by: Lerry Liner on: 01/15/2020 02:12 PM   Modules accepted: Orders

## 2020-01-15 NOTE — Addendum Note (Signed)
Addended by: Wilford Corner on: 01/15/2020 05:15 PM   Modules accepted: Orders

## 2020-01-15 NOTE — Progress Notes (Signed)
Subjective:    Patient ID: Sarah Gay, female    DOB: Jan 18, 1966, 54 y.o.   MRN: [id number]  HPI Here for a well exam. She has one issue to discuss. Last November while walking with a friend and her dog, the leash became tangled around her feet and she fell backwards. She landed flat on her back and she has had intermittent tightness and mild pain in the middle back since then. Nothing severe. Sometimes she takes some Tylenol but not usually.    Review of Systems  Constitutional: Negative.   HENT: Negative.   Eyes: Negative.   Respiratory: Negative.   Cardiovascular: Negative.   Gastrointestinal: Negative.   Genitourinary: Negative for decreased urine volume, difficulty urinating, dyspareunia, dysuria, enuresis, flank pain, frequency, hematuria, pelvic pain and urgency.  Musculoskeletal: Positive for back pain.  Skin: Negative.   Neurological: Negative.   Psychiatric/Behavioral: Negative.        Objective:   Physical Exam Constitutional:      General: She is not in acute distress.    Appearance: She is well-developed.  HENT:     Head: Normocephalic and atraumatic.     Right Ear: External ear normal.     Left Ear: External ear normal.     Nose: Nose normal.     Mouth/Throat:     Pharynx: No oropharyngeal exudate.  Eyes:     General: No scleral icterus.    Conjunctiva/sclera: Conjunctivae normal.     Pupils: Pupils are equal, round, and reactive to light.  Neck:     Thyroid: No thyromegaly.     Vascular: No JVD.  Cardiovascular:     Rate and Rhythm: Normal rate and regular rhythm.     Heart sounds: Normal heart sounds. No murmur heard.  No friction rub. No gallop.   Pulmonary:     Effort: Pulmonary effort is normal. No respiratory distress.     Breath sounds: Normal breath sounds. No wheezing or rales.  Chest:     Chest wall: No tenderness.  Abdominal:     General: Bowel sounds are normal. There is no distension.     Palpations: Abdomen is soft. There is no  mass.     Tenderness: There is no abdominal tenderness. There is no guarding or rebound.  Musculoskeletal:        General: Normal range of motion.     Cervical back: Normal range of motion and neck supple.     Comments: She is mildly tender over the middle back with some spasm, but ROM is full   Lymphadenopathy:     Cervical: No cervical adenopathy.  Skin:    General: Skin is warm and dry.     Findings: No erythema or rash.  Neurological:     Mental Status: She is alert and oriented to person, place, and time.     Cranial Nerves: No cranial nerve deficit.     Motor: No abnormal muscle tone.     Coordination: Coordination normal.     Deep Tendon Reflexes: Reflexes are normal and symmetric. Reflexes normal.  Psychiatric:        Behavior: Behavior normal.        Thought Content: Thought content normal.        Judgment: Judgment normal.           Assessment & Plan:  Well exam. We discussed diet and exercise. Get fasting labs. She has some muscel spasms in the back so I suggested she do  some stretching exercises several times a day.  Gershon Crane, MD

## 2020-01-15 NOTE — Addendum Note (Signed)
Addended by: Lerry Liner on: 01/15/2020 02:13 PM   Modules accepted: Orders

## 2020-01-16 LAB — CBC WITH DIFFERENTIAL/PLATELET
Basophils Absolute: 0.1 10*3/uL (ref 0.0–0.2)
Basos: 1 %
EOS (ABSOLUTE): 0.1 10*3/uL (ref 0.0–0.4)
Eos: 2 %
Hematocrit: 43.2 % (ref 34.0–46.6)
Hemoglobin: 14 g/dL (ref 11.1–15.9)
Immature Grans (Abs): 0 10*3/uL (ref 0.0–0.1)
Immature Granulocytes: 0 %
Lymphocytes Absolute: 2.9 10*3/uL (ref 0.7–3.1)
Lymphs: 46 %
MCH: 30.4 pg (ref 26.6–33.0)
MCHC: 32.4 g/dL (ref 31.5–35.7)
MCV: 94 fL (ref 79–97)
Monocytes Absolute: 0.4 10*3/uL (ref 0.1–0.9)
Monocytes: 7 %
Neutrophils Absolute: 2.8 10*3/uL (ref 1.4–7.0)
Neutrophils: 44 %
Platelets: 300 10*3/uL (ref 150–450)
RBC: 4.61 x10E6/uL (ref 3.77–5.28)
RDW: 12.4 % (ref 11.7–15.4)
WBC: 6.3 10*3/uL (ref 3.4–10.8)

## 2020-01-16 LAB — LIPID PANEL
Chol/HDL Ratio: 3.3 ratio (ref 0.0–4.4)
Cholesterol, Total: 214 mg/dL — ABNORMAL HIGH (ref 100–199)
HDL: 64 mg/dL (ref >39)
LDL Chol Calc (NIH): 135 mg/dL — ABNORMAL HIGH (ref 0–99)
Triglycerides: 82 mg/dL (ref 0–149)
VLDL Cholesterol Cal: 15 mg/dL (ref 5–40)

## 2020-01-16 LAB — SPECIMEN STATUS REPORT

## 2020-01-16 LAB — BASIC METABOLIC PANEL
BUN/Creatinine Ratio: 8 — ABNORMAL LOW (ref 9–23)
BUN: 7 mg/dL (ref 6–24)
CO2: 24 mmol/L (ref 20–29)
Calcium: 9.5 mg/dL (ref 8.7–10.2)
Chloride: 106 mmol/L (ref 96–106)
Creatinine, Ser: 0.87 mg/dL (ref 0.57–1.00)
GFR calc Af Amer: 86 mL/min/{1.73_m2} (ref >59)
GFR calc non Af Amer: 75 mL/min/{1.73_m2} (ref >59)
Glucose: 88 mg/dL (ref 65–99)
Potassium: 4.5 mmol/L (ref 3.5–5.2)
Sodium: 141 mmol/L (ref 134–144)

## 2020-01-16 LAB — HEPATIC FUNCTION PANEL
ALT: 8 IU/L (ref 0–32)
AST: 13 IU/L (ref 0–40)
Albumin: 4.3 g/dL (ref 3.8–4.9)
Alkaline Phosphatase: 65 IU/L (ref 44–121)
Bilirubin Total: 0.2 mg/dL (ref 0.0–1.2)
Bilirubin, Direct: <0.1 mg/dL (ref 0.00–0.40)
Total Protein: 6.7 g/dL (ref 6.0–8.5)

## 2020-01-16 LAB — TSH: TSH: 5.66 u[IU]/mL — ABNORMAL HIGH (ref 0.450–4.500)

## 2020-01-21 NOTE — Addendum Note (Signed)
Addended by: Gershon Crane A on: 01/21/2020 05:03 PM   Modules accepted: Orders

## 2020-01-23 NOTE — Telephone Encounter (Signed)
This encounter was created in error - please disregard.

## 2020-01-24 ENCOUNTER — Encounter: Payer: Self-pay | Admitting: Family Medicine

## 2020-02-03 ENCOUNTER — Telehealth: Payer: Self-pay

## 2020-02-03 MED ORDER — LEVOTHYROXINE SODIUM 50 MCG PO TABS: 50.0000 ug | ORAL_TABLET | Freq: Every day | ORAL | 0 refills | Status: DC

## 2020-02-03 MED ORDER — LEVOTHYROXINE SODIUM 50 MCG PO TABS: 50.0000 ug | ORAL_TABLET | Freq: Every day | ORAL | 3 refills | Status: DC

## 2020-02-03 NOTE — Telephone Encounter (Signed)
Patient returned call, advised of Dr. Claris Che note below, she verbalized understanding. She asks for a 30 day [facility] [address] to cover until mail order arrives. Rx sent.

## 2020-02-03 NOTE — Telephone Encounter (Signed)
Sent to [facility]  Patient called, left VM to return call if a short supply is needed at a local pharmacy.

## 2020-02-03 NOTE — Addendum Note (Signed)
Addended by: Wilford Corner on: 02/03/2020 04:03 PM   Modules accepted: Orders

## 2020-02-03 NOTE — Telephone Encounter (Signed)
She is mildly hypothyroid. Call in Synthroid 50 mcg daily, #90 with 3 rf. Recheck labs in 90 days

## 2020-02-03 NOTE — Telephone Encounter (Signed)
Patient called and says her TSH is high based on her lab results from [facility] that she received last Monday and is wanting to know what Dr. Clent Ridges wants to do. I advised I will give him the labs that were received and will call back with his recommendation.  Per results from [facility] collected on 01/24/20:  T4-0.99 TSH 5.190 High T3-2.7

## 2020-02-03 NOTE — Addendum Note (Signed)
Addended by: Wilford Corner on: 02/03/2020 04:33 PM   Modules accepted: Orders

## 2020-02-25 ENCOUNTER — Other Ambulatory Visit: Payer: Self-pay | Admitting: Family Medicine

## 2020-02-26 ENCOUNTER — Other Ambulatory Visit: Payer: Self-pay

## 2020-02-26 MED ORDER — SUMATRIPTAN SUCCINATE 100 MG PO TABS: ORAL_TABLET | ORAL | 11 refills | Status: DC

## 2020-05-29 ENCOUNTER — Telehealth: Payer: Self-pay | Admitting: Family Medicine

## 2020-05-29 DIAGNOSIS — E039 Hypothyroidism, unspecified: Secondary | ICD-10-CM

## 2020-05-29 NOTE — Telephone Encounter (Signed)
HYDROcodone-acetaminophen (NORCO) 5-325 MG tablet    [id number] [place], [place] - [address] Phone:  [phone]  Fax:  [phone]

## 2020-05-29 NOTE — Telephone Encounter (Signed)
Patient was wanting to know if she needs to get her labs done again for her TSH. The last labs she said was in October.  Please advise

## 2020-05-29 NOTE — Telephone Encounter (Signed)
Last office visit- 01/15/20 Didn't see medication listed No future appointment listed

## 2020-06-01 NOTE — Telephone Encounter (Signed)
Patient needs an order put in to have her labs (Tyhroid) done again at [facility] (fax # [phone]).   Please review and advise.  Thanks. Dm/cma .

## 2020-06-01 NOTE — Telephone Encounter (Signed)
She will need a PMV  

## 2020-06-01 NOTE — Telephone Encounter (Signed)
Patient notified VIA phone and will cll back later to schedule an appt for PMV.  Dm/cma

## 2020-06-02 DIAGNOSIS — E039 Hypothyroidism, unspecified: Secondary | ICD-10-CM | POA: Insufficient documentation

## 2020-06-02 NOTE — Telephone Encounter (Signed)
Lab order faxed to [facility] @ [phone].  Patient notified VIA phone.  Dm/cma

## 2020-06-02 NOTE — Telephone Encounter (Signed)
The order is ready to fax  

## 2020-06-10 ENCOUNTER — Other Ambulatory Visit: Payer: Self-pay | Admitting: Family Medicine

## 2020-06-11 LAB — TSH: TSH: 3.6 u[IU]/mL (ref 0.450–4.500)

## 2020-06-11 LAB — T3, FREE: T3, Free: 3.2 pg/mL (ref 2.0–4.4)

## 2020-06-11 LAB — T4, FREE: Free T4: 1.43 ng/dL (ref 0.82–1.77)

## 2020-06-12 ENCOUNTER — Ambulatory Visit: Payer: 59 | Admitting: Family Medicine

## 2020-06-12 ENCOUNTER — Other Ambulatory Visit: Payer: Self-pay

## 2020-06-15 ENCOUNTER — Encounter: Payer: Self-pay | Admitting: Family Medicine

## 2020-06-15 ENCOUNTER — Ambulatory Visit (INDEPENDENT_AMBULATORY_CARE_PROVIDER_SITE_OTHER): Payer: 59 | Admitting: Family Medicine

## 2020-06-15 ENCOUNTER — Other Ambulatory Visit: Payer: Self-pay

## 2020-06-15 VITALS — BP 98/68 | HR 93 | Temp 98.2°F | Wt 131.0 lb

## 2020-06-15 DIAGNOSIS — F1190 Opioid use, unspecified, uncomplicated: Secondary | ICD-10-CM

## 2020-06-15 DIAGNOSIS — G43801 Other migraine, not intractable, with status migrainosus: Secondary | ICD-10-CM

## 2020-06-15 MED ORDER — LEVOTHYROXINE SODIUM 25 MCG PO TABS: 25.0000 ug | ORAL_TABLET | Freq: Every day | ORAL | 3 refills | Status: DC

## 2020-06-15 MED ORDER — HYDROCODONE-ACETAMINOPHEN 5-325 MG PO TABS: 1.0000 | ORAL_TABLET | Freq: Four times a day (QID) | ORAL | 0 refills | Status: DC | PRN

## 2020-06-15 NOTE — Progress Notes (Signed)
   Subjective:    Patient ID: Sarah Gay, female    DOB: 1966-02-09, 55 y.o.   MRN: [id number]  HPI Here for pain management. She is doing well.  Indication for chronic opioid: migraines  Medication and dose: Norco 5-325  # pills per month: 120 Last UDS date: 06-15-20 Opioid Treatment Agreement signed (Y/N): 12-29-17 Opioid Treatment Agreement last reviewed with patient:  06-15-20 NCCSRS reviewed this encounter (include red flags): Yes    Review of Systems     Objective:   Physical Exam        Assessment & Plan:  Pain management, meds were refilled.  Gershon Crane, MD

## 2020-06-16 ENCOUNTER — Telehealth: Payer: Self-pay

## 2020-06-16 MED ORDER — ALPRAZOLAM 0.5 MG PO TABS
0.5000 mg | ORAL_TABLET | Freq: Three times a day (TID) | ORAL | 5 refills | Status: DC | PRN
Start: 2020-06-16 — End: 2021-01-25

## 2020-06-16 NOTE — Telephone Encounter (Signed)
I refilled the Xanax. The PA will be sent to [place] from the pharmacy

## 2020-06-16 NOTE — Telephone Encounter (Signed)
-----   Message from Nelwyn Salisbury, MD sent at 06/16/2020  7:43 AM EST ----- All results are within normal limits

## 2020-06-16 NOTE — Telephone Encounter (Signed)
Pt was made aware of lab results.   She then stated that a PA is needed for Norco prescription and Xanax needs to be filled.

## 2020-06-17 LAB — DRUG MONITOR, PANEL 1, W/CONF, URINE
Alphahydroxyalprazolam: 220 ng/mL — ABNORMAL HIGH (ref <25)
Alphahydroxymidazolam: NEGATIVE ng/mL (ref <50)
Alphahydroxytriazolam: NEGATIVE ng/mL (ref <50)
Aminoclonazepam: NEGATIVE ng/mL (ref <25)
Amphetamines: NEGATIVE ng/mL (ref <500)
Barbiturates: NEGATIVE ng/mL (ref <300)
Benzodiazepines: POSITIVE ng/mL — AB (ref <100)
Cocaine Metabolite: NEGATIVE ng/mL (ref <150)
Creatinine: 66.2 mg/dL
Hydroxyethylflurazepam: NEGATIVE ng/mL (ref <50)
Lorazepam: NEGATIVE ng/mL (ref <50)
Marijuana Metabolite: NEGATIVE ng/mL (ref <20)
Methadone Metabolite: NEGATIVE ng/mL (ref <100)
Nordiazepam: NEGATIVE ng/mL (ref <50)
Opiates: NEGATIVE ng/mL (ref <100)
Oxazepam: NEGATIVE ng/mL (ref <50)
Oxidant: NEGATIVE ug/mL
Oxycodone: NEGATIVE ng/mL (ref <100)
Phencyclidine: NEGATIVE ng/mL (ref <25)
Temazepam: NEGATIVE ng/mL (ref <50)
pH: 6.9 (ref 4.5–9.0)

## 2020-06-17 LAB — DM TEMPLATE

## 2020-07-02 ENCOUNTER — Telehealth: Payer: Self-pay | Admitting: Family Medicine

## 2020-07-02 NOTE — Telephone Encounter (Signed)
Patient is calling and stated that the pharmacy needs pre-authorization for HYDROcodone-acetaminophen (NORCO) 5-325 MG tablet before it can be filled, please advise. CB is [phone]

## 2020-07-03 NOTE — Telephone Encounter (Signed)
Spoke with pharmacist Randa Evens, she stated that Hydrocodone is currently on backorder.    A prior authorization is required it the prescription is for more than 7 day supply, per insurance company.   Left voicemail for patient to call office to inform of another pharmacy to send medication to

## 2020-07-03 NOTE — Telephone Encounter (Signed)
Pt called to get clarification on the msg that was left and she stated that she will call another pharmacy to see who has the Rx in stock and will give [place] a call back to let us know which pharmacy she found it at.

## 2020-07-03 NOTE — Telephone Encounter (Signed)
I agree, we can send it to another pharmacy that has it

## 2020-07-07 NOTE — Telephone Encounter (Signed)
Noted. Will wait for the patient to call back with the pharmacy name.

## 2020-07-11 NOTE — Telephone Encounter (Signed)
Left a detailed message for pt to call the office back regarding her Hydrocodone refill

## 2020-07-13 NOTE — Telephone Encounter (Signed)
Pt is calling in stating that she found out what the problem is with Rx Hydrocodone-acetaminophen (NORCO) 5-325 MG that the insurance is thinking that the medication is a new Rx and they will only give her 7 initially (b/c protocol is that its an new Rx) and then send in the remaining.  Pt would like to get the advise of Dr. Clent Ridges if she should go and get the 7 and then he send another Rx in for the remaining.  Pt also stated that [facility] but pt was diagnosed by the insurance as a newpt but she has been on this medication for years for migraines  [facility] [address] [facility] [place]  [phone] (P).  Pt stated if you have any questions you can give her a call.

## 2020-07-13 NOTE — Telephone Encounter (Signed)
Spoke with pt advised that her pharmacy state that the medication is on back order, state that pt needs to call her insurance to check if they will approve for pt to get the 120 tablets. Pt state that she will call the office after taking with her insurance

## 2020-07-14 NOTE — Telephone Encounter (Signed)
Patient is calling and stated that her insurance said if provider can order medication through [facility] and to call [phone] and they need a PA. Pt is asking for a call back [phone]

## 2020-07-15 NOTE — Telephone Encounter (Signed)
Pt Prior Auth was sent for approval

## 2020-07-16 ENCOUNTER — Telehealth: Payer: Self-pay | Admitting: Family Medicine

## 2020-07-16 MED ORDER — HYDROCODONE-ACETAMINOPHEN 5-325 MG PO TABS: 1.0000 | ORAL_TABLET | Freq: Four times a day (QID) | ORAL | 0 refills | Status: DC | PRN

## 2020-07-16 MED ORDER — HYDROCODONE-ACETAMINOPHEN 5-325 MG PO TABS: 1.0000 | ORAL_TABLET | Freq: Four times a day (QID) | ORAL | 0 refills | Status: AC | PRN

## 2020-07-16 NOTE — Telephone Encounter (Signed)
Pt Prior Authorization for Hydrocodone 5/325 mg was approved today thru 01/15/2021, Pt pharmacy [facility] is requesting for a new Rx for 120 tablets. Please advise

## 2020-07-16 NOTE — Addendum Note (Signed)
Addended by: Gershon Crane A on: 07/16/2020 12:14 PM   Modules accepted: Orders

## 2020-07-16 NOTE — Telephone Encounter (Signed)
Pt has been notified.

## 2020-07-16 NOTE — Telephone Encounter (Signed)
Done

## 2020-07-16 NOTE — Telephone Encounter (Signed)
Pt is calling in wanting to have a call back to explain exactly what is need and did not want to elaborate on it with me only want to speak with the CMA.

## 2020-07-21 ENCOUNTER — Telehealth: Payer: Self-pay | Admitting: Family Medicine

## 2020-07-21 NOTE — Telephone Encounter (Signed)
Sarah Gay is calling and stated that she needs clarification for HYDROcodone-acetaminophen (NORCO) 5-325 MG tablet and ALPRAZolam (XANAX) 0.5 MG tablet, please advise. CB is [phone] reference number [id number]

## 2020-07-22 NOTE — Telephone Encounter (Signed)
Please call them to see what they want. Her last PMV was on 06-15-20 and we did the UDS that day

## 2020-07-22 NOTE — Telephone Encounter (Signed)
Requesting: Alprazolam 0.5 mg  Contract: none recent UDS: none recent Last Visit: 06/15/2020 Next Visit: none pending Last Refill: 06/16/2020

## 2020-07-23 NOTE — Telephone Encounter (Signed)
Attempted to call [facility] regarding following message the phone rang and was on hold for long , will try call back

## 2020-07-30 NOTE — Telephone Encounter (Signed)
Spoke with Wynona Canes with pt pharmacy [facility] regarding pt medication, state that they will review pt request and will contact our office with approval/denial of the medication

## 2020-08-04 NOTE — Telephone Encounter (Deleted)
error 

## 2020-08-05 ENCOUNTER — Encounter: Payer: Self-pay | Admitting: Family Medicine

## 2020-08-05 ENCOUNTER — Telehealth (INDEPENDENT_AMBULATORY_CARE_PROVIDER_SITE_OTHER): Payer: 59 | Admitting: Family Medicine

## 2020-08-05 DIAGNOSIS — J40 Bronchitis, not specified as acute or chronic: Secondary | ICD-10-CM | POA: Diagnosis not present

## 2020-08-05 MED ORDER — AZITHROMYCIN 250 MG PO TABS: ORAL_TABLET | ORAL | 0 refills | Status: DC

## 2020-08-05 MED ORDER — METHYLPREDNISOLONE 4 MG PO TBPK: ORAL_TABLET | ORAL | 0 refills | Status: DC

## 2020-08-05 NOTE — Progress Notes (Signed)
   Subjective:    Patient ID: Sarah Gay, female    DOB: 1965-06-13, 55 y.o.   MRN: [id number]  HPI Virtual Visit via Telephone Note  I connected with the patient on 08/05/20 at  1:45 PM EDT by telephone and verified that I am speaking with the correct person using two identifiers.   I discussed the limitations, risks, security and privacy concerns of performing an evaluation and management service by telephone and the availability of in person appointments. I also discussed with the patient that there may be a patient responsible charge related to this service. The patient expressed understanding and agreed to proceed.  Location patient: home Location provider: work or home office Participants present for the call: patient, provider Patient did not have a visit in the prior 7 days to address this/these issue(s).   History of Present Illness: Here for one week of upper respiratory symptoms. No fever or body aches or NVD. She started with a ST and PND, but this stopped after 2 days. Then she developed chest tightness, wheezing, and coughing up yellow sputum. No chest pain. Using Mucinex and drinking water. Incidentally, she also had urinary burning last week and her OB_GYN treated her for a UTI with Nitrofurantoin. This has now resolved.    Observations/Objective: Patient sounds cheerful and well on the phone. I do not appreciate any SOB. Speech and thought processing are grossly intact. Patient reported vitals:  Assessment and Plan: Bronchitis, treat with a Zpack and a Medrol dose pack. Recheck as needed. Gershon Crane, MD   Follow Up Instructions:     [phone] 5-10 [phone] 11-20 [id number] 21-30 I did not refer this patient for an OV in the next 24 hours for this/these issue(s).  I discussed the assessment and treatment plan with the patient. The patient was provided an opportunity to ask questions and all were answered. The patient agreed with the plan and demonstrated an  understanding of the instructions.   The patient was advised to call back or seek an in-person evaluation if the symptoms worsen or if the condition fails to improve as anticipated.  I provided 17 minutes of non-face-to-face time during this encounter.   Gershon Crane, MD    Review of Systems     Objective:   Physical Exam        Assessment & Plan:

## 2020-08-13 NOTE — Telephone Encounter (Deleted)
error 

## 2020-08-18 NOTE — Telephone Encounter (Signed)
Can you close this unsigned order so I can close this message?

## 2020-08-28 ENCOUNTER — Other Ambulatory Visit: Payer: Self-pay

## 2020-08-28 ENCOUNTER — Encounter: Payer: Self-pay | Admitting: Family Medicine

## 2020-08-28 ENCOUNTER — Ambulatory Visit: Payer: 59 | Admitting: Family Medicine

## 2020-08-28 VITALS — BP 120/86 | HR 93 | Temp 98.4°F | Wt 122.4 lb

## 2020-08-28 DIAGNOSIS — F418 Other specified anxiety disorders: Secondary | ICD-10-CM | POA: Diagnosis not present

## 2020-08-28 DIAGNOSIS — L509 Urticaria, unspecified: Secondary | ICD-10-CM | POA: Diagnosis not present

## 2020-08-28 MED ORDER — HYDROXYZINE PAMOATE 25 MG PO CAPS: 25.0000 mg | ORAL_CAPSULE | ORAL | 2 refills | Status: DC | PRN

## 2020-08-28 MED ORDER — METHOCARBAMOL 500 MG PO TABS: 500.0000 mg | ORAL_TABLET | Freq: Four times a day (QID) | ORAL | 2 refills | Status: DC | PRN

## 2020-08-28 MED ORDER — METHYLPREDNISOLONE ACETATE 40 MG/ML IJ SUSP
40.0000 mg | Freq: Once | INTRAMUSCULAR | Status: AC
  Administered 2020-08-28: 40 mg via INTRAMUSCULAR

## 2020-08-28 MED ORDER — METHYLPREDNISOLONE ACETATE 80 MG/ML IJ SUSP
80.0000 mg | Freq: Once | INTRAMUSCULAR | Status: AC
Start: 2020-08-28 — End: 2020-08-28
  Administered 2020-08-28: 80 mg via INTRAMUSCULAR

## 2020-08-28 MED ORDER — FLUOXETINE HCL 40 MG PO CAPS: 40.0000 mg | ORAL_CAPSULE | Freq: Every day | ORAL | 3 refills | Status: DC

## 2020-08-28 NOTE — Progress Notes (Signed)
   Subjective:    Patient ID: Sarah Gay, female    DOB: January 13, 1966, 55 y.o.   MRN: [id number]  HPI Here for 5 days of itching all over her body, especially on the back, and she has an itchy burning rash in both armpits. Benadryl helps briefly. Otherwise she says she has been under tremendous stress lately from issues at work and at home. She gets tearful and her body feels tense. She is taking Xanax TID but it only helps a little. Of note she took Prozac for years until she stopped it a year ago.    Review of Systems  Constitutional: Negative.   Respiratory: Negative.   Cardiovascular: Negative.   Skin: Positive for rash.  Psychiatric/Behavioral: The patient is nervous/anxious.        Objective:   Physical Exam Constitutional:      General: She is not in acute distress.    Appearance: Normal appearance.  Cardiovascular:     Rate and Rhythm: Normal rate and regular rhythm.     Pulses: Normal pulses.     Heart sounds: Normal heart sounds.  Pulmonary:     Effort: Pulmonary effort is normal.     Breath sounds: Normal breath sounds.  Skin:    Comments: There are patches of urticaria in both axillae. The skin elsewhere is clear   Neurological:     Mental Status: She is alert.  Psychiatric:        Thought Content: Thought content normal.        Judgment: Judgment normal.     Comments: Anxious            Assessment & Plan:  She has hives as a result of feeling lots of stress. We will start her back on Prozac 40 mg daily. Add Xanax as needed. For the hives, she is given a shot of DepoMedrol. Add Hydroxyzine as needed.  Gershon Crane, MD

## 2020-09-01 ENCOUNTER — Telehealth: Payer: Self-pay | Admitting: Family Medicine

## 2020-09-01 NOTE — Telephone Encounter (Signed)
error 

## 2020-09-24 ENCOUNTER — Telehealth: Payer: Self-pay | Admitting: Family Medicine

## 2020-09-24 NOTE — Telephone Encounter (Signed)
LMOV for patient returning call from patient stating she received a bill from [facility] for labs when the labs should have went to [facility].  Also reached out to [facility] lab to to get information on how we can get the bill resolved. Once I receive the bill information from patient, Dyane Dustman will resolve the bill issue.

## 2021-01-22 ENCOUNTER — Other Ambulatory Visit: Payer: Self-pay | Admitting: Family Medicine

## 2021-01-22 NOTE — Telephone Encounter (Signed)
Last office visit- 08/28/20 Last refill- 06/16/20--90 tabs, 5 refills  No future office visit scheduled.   Can this patient receive a refill?

## 2021-02-25 ENCOUNTER — Other Ambulatory Visit: Payer: Self-pay | Admitting: Family Medicine

## 2021-08-02 ENCOUNTER — Other Ambulatory Visit: Payer: Self-pay | Admitting: Family Medicine

## 2021-08-02 NOTE — Telephone Encounter (Signed)
Pt LOV was on 08/28/2020 ?Last refill done on 01/25/2021 ?Please advise ?

## 2021-08-05 ENCOUNTER — Other Ambulatory Visit: Payer: Self-pay | Admitting: Family Medicine

## 2021-12-10 ENCOUNTER — Other Ambulatory Visit: Payer: Self-pay | Admitting: Family Medicine

## 2021-12-30 ENCOUNTER — Other Ambulatory Visit: Payer: Self-pay | Admitting: Family Medicine

## 2022-02-11 ENCOUNTER — Ambulatory Visit (INDEPENDENT_AMBULATORY_CARE_PROVIDER_SITE_OTHER): Payer: 59 | Admitting: Family Medicine

## 2022-02-11 ENCOUNTER — Encounter: Payer: Self-pay | Admitting: Family Medicine

## 2022-02-11 VITALS — BP 110/62 | HR 93 | Temp 98.3°F | Ht 65.0 in | Wt 132.5 lb

## 2022-02-11 DIAGNOSIS — Z0000 Encounter for general adult medical examination without abnormal findings: Secondary | ICD-10-CM | POA: Diagnosis not present

## 2022-02-11 DIAGNOSIS — F1190 Opioid use, unspecified, uncomplicated: Secondary | ICD-10-CM

## 2022-02-11 MED ORDER — OMEPRAZOLE 40 MG PO CPDR: 40.0000 mg | DELAYED_RELEASE_CAPSULE | Freq: Every day | ORAL | 3 refills | Status: DC

## 2022-02-11 MED ORDER — ALPRAZOLAM 0.5 MG PO TABS: 0.5000 mg | ORAL_TABLET | Freq: Three times a day (TID) | ORAL | 5 refills | Status: DC | PRN

## 2022-02-11 MED ORDER — ESCITALOPRAM OXALATE 5 MG PO TABS: 5.0000 mg | ORAL_TABLET | Freq: Every day | ORAL | 2 refills | Status: DC

## 2022-02-11 MED ORDER — LEVOTHYROXINE SODIUM 25 MCG PO TABS: 25.0000 ug | ORAL_TABLET | Freq: Every day | ORAL | 3 refills | Status: DC

## 2022-02-11 MED ORDER — SUMATRIPTAN SUCCINATE 100 MG PO TABS: ORAL_TABLET | ORAL | 9 refills | Status: DC

## 2022-02-11 NOTE — Progress Notes (Signed)
Subjective:    Patient ID: Sarah Gay, female    DOB: Feb 22, 1966, 56 y.o.   MRN: [id number]  HPI Here for a well exam. She has a few issues to discuss. First she has been very stressed at work lately, and she tends to take this home with her in the evenings. She admits to some mild depression or sadness at times, but anxiety is her main symptom. She takes Xanax 3-4 times a day, and this helps but it doesn't last long. She sleeps well. She tried  Prozac in the past but this was too strong for her and made her feel nervous. The other issue is low back pain that sometimes radiates down the right leg. This started about a year ago. It is worst when she gets up to walk after sitting for a long period of time. There is no numbness or weakness. Her migraines are stable.    Review of Systems  Constitutional: Negative.   HENT: Negative.    Eyes: Negative.   Respiratory: Negative.    Cardiovascular: Negative.   Gastrointestinal: Negative.   Genitourinary:  Negative for decreased urine volume, difficulty urinating, dyspareunia, dysuria, enuresis, flank pain, frequency, hematuria, pelvic pain and urgency.  Musculoskeletal:  Positive for back pain.  Skin: Negative.   Neurological:  Positive for headaches.  Psychiatric/Behavioral:  Negative for agitation, behavioral problems, confusion, decreased concentration, dysphoric mood, hallucinations, self-injury, sleep disturbance and suicidal ideas. The patient is nervous/anxious.        Objective:   Physical Exam Constitutional:      General: She is not in acute distress.    Appearance: Normal appearance. She is well-developed.  HENT:     Head: Normocephalic and atraumatic.     Right Ear: External ear normal.     Left Ear: External ear normal.     Nose: Nose normal.     Mouth/Throat:     Pharynx: No oropharyngeal exudate.  Eyes:     General: No scleral icterus.    Conjunctiva/sclera: Conjunctivae normal.     Pupils: Pupils are equal, round,  and reactive to light.  Neck:     Thyroid: No thyromegaly.     Vascular: No JVD.  Cardiovascular:     Rate and Rhythm: Normal rate and regular rhythm.     Heart sounds: Normal heart sounds. No murmur heard.    No friction rub. No gallop.  Pulmonary:     Effort: Pulmonary effort is normal. No respiratory distress.     Breath sounds: Normal breath sounds. No wheezing or rales.  Chest:     Chest wall: No tenderness.  Abdominal:     General: Bowel sounds are normal. There is no distension.     Palpations: Abdomen is soft. There is no mass.     Tenderness: There is no abdominal tenderness. There is no guarding or rebound.  Musculoskeletal:        General: No tenderness. Normal range of motion.     Cervical back: Normal range of motion and neck supple.  Lymphadenopathy:     Cervical: No cervical adenopathy.  Skin:    General: Skin is warm and dry.     Findings: No erythema or rash.  Neurological:     Mental Status: She is alert and oriented to person, place, and time.     Cranial Nerves: No cranial nerve deficit.     Motor: No abnormal muscle tone.     Coordination: Coordination normal.  Deep Tendon Reflexes: Reflexes are normal and symmetric. Reflexes normal.  Psychiatric:        Behavior: Behavior normal.        Thought Content: Thought content normal.        Judgment: Judgment normal.           Assessment & Plan:  Well exam. We discussed diet and exercise. Get fasting labs. She has right sided low back pain with sciatica. We agreed to simply watch this and she will return as needed. For the anxiety, she will try Lexapro 5 mg daily. Recheck in 3-4 weeks.  Gershon Crane, MD

## 2022-02-12 LAB — CBC WITH DIFFERENTIAL/PLATELET
Basophils Absolute: 0.1 10*3/uL (ref 0.0–0.2)
Basos: 1 %
EOS (ABSOLUTE): 0.1 10*3/uL (ref 0.0–0.4)
Eos: 2 %
Hematocrit: 41.8 % (ref 34.0–46.6)
Hemoglobin: 14.3 g/dL (ref 11.1–15.9)
Immature Grans (Abs): 0 10*3/uL (ref 0.0–0.1)
Immature Granulocytes: 0 %
Lymphocytes Absolute: 2.9 10*3/uL (ref 0.7–3.1)
Lymphs: 34 %
MCH: 30.6 pg (ref 26.6–33.0)
MCHC: 34.2 g/dL (ref 31.5–35.7)
MCV: 89 fL (ref 79–97)
Monocytes Absolute: 0.4 10*3/uL (ref 0.1–0.9)
Monocytes: 5 %
Neutrophils Absolute: 5 10*3/uL (ref 1.4–7.0)
Neutrophils: 58 %
Platelets: 322 10*3/uL (ref 150–450)
RBC: 4.68 x10E6/uL (ref 3.77–5.28)
RDW: 11.8 % (ref 11.7–15.4)
WBC: 8.5 10*3/uL (ref 3.4–10.8)

## 2022-02-12 LAB — HEPATIC FUNCTION PANEL
ALT: 11 IU/L (ref 0–32)
AST: 15 IU/L (ref 0–40)
Albumin: 4.4 g/dL (ref 3.8–4.9)
Alkaline Phosphatase: 82 IU/L (ref 44–121)
Bilirubin Total: 0.2 mg/dL (ref 0.0–1.2)
Bilirubin, Direct: <0.1 mg/dL (ref 0.00–0.40)
Total Protein: 6.8 g/dL (ref 6.0–8.5)

## 2022-02-12 LAB — HEMOGLOBIN A1C
Est. average glucose Bld gHb Est-mCnc: 114 mg/dL
Hgb A1c MFr Bld: 5.6 % (ref 4.8–5.6)

## 2022-02-12 LAB — DRUG SCREEN, URINE
Amphetamines, Urine: NEGATIVE ng/mL
Barbiturate screen, urine: NEGATIVE ng/mL
Benzodiazepine Quant, Ur: POSITIVE ng/mL — AB
Cannabinoid Quant, Ur: NEGATIVE ng/mL
Cocaine (Metab.): NEGATIVE ng/mL
Opiate Quant, Ur: POSITIVE ng/mL — AB
PCP Quant, Ur: NEGATIVE ng/mL

## 2022-02-12 LAB — TSH: TSH: 4.23 u[IU]/mL (ref 0.450–4.500)

## 2022-02-12 LAB — LIPID PANEL
Chol/HDL Ratio: 3.3 ratio (ref 0.0–4.4)
Cholesterol, Total: 206 mg/dL — ABNORMAL HIGH (ref 100–199)
HDL: 63 mg/dL (ref >39)
LDL Chol Calc (NIH): 125 mg/dL — ABNORMAL HIGH (ref 0–99)
Triglycerides: 102 mg/dL (ref 0–149)
VLDL Cholesterol Cal: 18 mg/dL (ref 5–40)

## 2022-02-12 LAB — T3, FREE: T3, Free: 2.7 pg/mL (ref 2.0–4.4)

## 2022-02-12 LAB — BASIC METABOLIC PANEL WITH GFR
BUN/Creatinine Ratio: 14 (ref 9–23)
BUN: 12 mg/dL (ref 6–24)
CO2: 21 mmol/L (ref 20–29)
Calcium: 9.3 mg/dL (ref 8.7–10.2)
Chloride: 105 mmol/L (ref 96–106)
Creatinine, Ser: 0.83 mg/dL (ref 0.57–1.00)
Glucose: 88 mg/dL (ref 70–99)
Potassium: 4.4 mmol/L (ref 3.5–5.2)
Sodium: 142 mmol/L (ref 134–144)
eGFR: 82 mL/min/1.73 (ref >59)

## 2022-02-12 LAB — T4, FREE: Free T4: 1.2 ng/dL (ref 0.82–1.77)

## 2022-02-13 ENCOUNTER — Other Ambulatory Visit: Payer: Self-pay | Admitting: Family Medicine

## 2022-03-06 ENCOUNTER — Other Ambulatory Visit: Payer: Self-pay | Admitting: Family Medicine

## 2022-03-06 DIAGNOSIS — F418 Other specified anxiety disorders: Secondary | ICD-10-CM

## 2022-03-11 ENCOUNTER — Other Ambulatory Visit: Payer: Self-pay | Admitting: Family Medicine

## 2022-05-23 ENCOUNTER — Telehealth: Payer: Self-pay | Admitting: Family Medicine

## 2022-05-23 NOTE — Telephone Encounter (Signed)
Pt called to request a refill of the following:   HYDROcodone-acetaminophen ([place]) 5-325 MG tablet (Expired)  LOV:  02/11/22  Pt asked if MD could send refill or does MD want Pt to come in for an OV first ?  Please advise.  [facility] ([place], [place] Phone: [phone]  Fax: [phone]

## 2022-05-24 NOTE — Telephone Encounter (Signed)
Pt LOV was ov 02/11/22 Last refill was done on 06/15/20 Please advise

## 2022-05-24 NOTE — Telephone Encounter (Signed)
She is due for a PMV

## 2022-05-25 NOTE — Telephone Encounter (Signed)
Spoke with patient, message given. Pain management visit is required for medication refill.     Pt voiced understanding will contact office later to schedule an appointment.

## 2022-05-27 ENCOUNTER — Encounter: Payer: Self-pay | Admitting: Family Medicine

## 2022-05-27 ENCOUNTER — Ambulatory Visit: Payer: 59 | Admitting: Family Medicine

## 2022-05-27 VITALS — BP 118/76 | HR 88 | Temp 98.0°F | Wt 140.0 lb

## 2022-05-27 DIAGNOSIS — G43801 Other migraine, not intractable, with status migrainosus: Secondary | ICD-10-CM | POA: Diagnosis not present

## 2022-05-27 DIAGNOSIS — F1190 Opioid use, unspecified, uncomplicated: Secondary | ICD-10-CM | POA: Diagnosis not present

## 2022-05-27 DIAGNOSIS — M542 Cervicalgia: Secondary | ICD-10-CM | POA: Diagnosis not present

## 2022-05-27 DIAGNOSIS — F418 Other specified anxiety disorders: Secondary | ICD-10-CM

## 2022-05-27 MED ORDER — ESCITALOPRAM OXALATE 5 MG PO TABS: 10.0000 mg | ORAL_TABLET | Freq: Every day | ORAL | 0 refills | Status: DC

## 2022-05-27 MED ORDER — LEVOTHYROXINE SODIUM 50 MCG PO TABS: 50.0000 ug | ORAL_TABLET | Freq: Every day | ORAL | 3 refills | Status: DC

## 2022-05-27 MED ORDER — HYDROCODONE-ACETAMINOPHEN 5-325 MG PO TABS: 1.0000 | ORAL_TABLET | Freq: Four times a day (QID) | ORAL | 0 refills | Status: DC | PRN

## 2022-05-27 NOTE — Progress Notes (Signed)
   Subjective:    Patient ID: Sarah Gay, female    DOB: [birth year], 57 y.o.   MRN: [id number]  HPI Here for pain management. Her migraines are stable but she has been having more neck pain lately. She has not seen Dr. Hal Neer (who did her cervical spine surgery) for some time.    Review of Systems  Constitutional: Negative.   Musculoskeletal:  Positive for neck pain.  Neurological:  Positive for headaches.       Objective:   Physical Exam Constitutional:      Appearance: Normal appearance.  Neurological:     Mental Status: She is alert.           Assessment & Plan:  Pain management.  Indication for chronic opioid: migraines  Medication and dose: Norco 5-325 # pills per month: 120 Last UDS date: 02-11-22 Opioid Treatment Agreement signed (Y/N): 12-29-17 Opioid Treatment Agreement last reviewed with patient:  05-27-22 NCCSRS reviewed this encounter (include red flags): Yes Meds were refilled. Refer her back to Dr. Hal Neer.  Alysia Penna, MD

## 2022-05-31 ENCOUNTER — Telehealth: Payer: Self-pay | Admitting: Family Medicine

## 2022-05-31 DIAGNOSIS — F418 Other specified anxiety disorders: Secondary | ICD-10-CM

## 2022-05-31 NOTE — Telephone Encounter (Addendum)
Pt called to FU on her request for refills of the following:  HYDROcodone-acetaminophen (NORCO) 5-325 MG tablet escitalopram (LEXAPRO) 5 MG tablet  levothyroxine (SYNTHROID) 50 MCG tablet   Pt wanted to make sure all 3 were sent via:   [facility] ([place], [place] Phone: [phone]  Fax: [phone]     Pt states she contacted [facility] and was told they have not received anything for the Hydrocodone or the Lexapro.   LOV:   05/27/22  Please advise.

## 2022-06-01 NOTE — Telephone Encounter (Signed)
Pt called to say [facility] is still waiting for the PA for the HYDROcodone-acetaminophen (NORCO) 5-325 MG table  refill.   Please advise.

## 2022-06-03 ENCOUNTER — Telehealth: Payer: Self-pay | Admitting: Family Medicine

## 2022-06-03 NOTE — Telephone Encounter (Signed)
Patient calling to check on progress of these. [facility] is on the line they do not have the escitalopram (LEXAPRO) 5 MG tablet prescription. There is no pharmacy on the script. Regarding HYDROcodone-acetaminophen (NORCO) 5-325 MG tablet [facility] only fill for 30d max. Asks that [id number] [place] [place] [phone] Fax: [phone]   Please send both to the above pharmacy.

## 2022-06-03 NOTE — Telephone Encounter (Signed)
Asking for provider to check to confirm that it's safe for patient to be taking the combo of HYDROcodone-acetaminophen (NORCO) 5-325 MG tablet and ALPRAZolam (XANAX) 0.5 MG tablet  Ref # [id number]

## 2022-06-03 NOTE — Telephone Encounter (Signed)
Yes this is safe, she has been taking these for years

## 2022-06-03 NOTE — Telephone Encounter (Signed)
Please advise 

## 2022-06-06 MED ORDER — HYDROCODONE-ACETAMINOPHEN 5-325 MG PO TABS: 1.0000 | ORAL_TABLET | Freq: Four times a day (QID) | ORAL | 0 refills | Status: DC | PRN

## 2022-06-06 MED ORDER — ESCITALOPRAM OXALATE 5 MG PO TABS: 10.0000 mg | ORAL_TABLET | Freq: Every day | ORAL | 0 refills | Status: DC

## 2022-06-06 NOTE — Telephone Encounter (Addendum)
[facility]     Pharmacy updated .     I've sent refill for  [facility]oprom 23m to [facility].

## 2022-06-06 NOTE — Telephone Encounter (Signed)
Done

## 2022-06-07 NOTE — Telephone Encounter (Signed)
Spoke with pharmacist Shawn.   Message from Dr. Sarajane Jews given.

## 2022-06-10 ENCOUNTER — Telehealth: Payer: Self-pay | Admitting: Family Medicine

## 2022-06-10 NOTE — Telephone Encounter (Signed)
Pt PA for Hydrocodone was approved on 06/10/22 thru 12/09/22. Pt is aware

## 2022-06-10 NOTE — Telephone Encounter (Signed)
Pt called to say [facility] needs the PA for this refill request    escitalopram (LEXAPRO) 5 MG tablet   [facility]  Please send fax to:   [phone]  Pt informed that MD is OOO.

## 2022-06-10 NOTE — Telephone Encounter (Signed)
Done

## 2022-06-10 NOTE — Telephone Encounter (Signed)
[facility] needs PA for this refill:  HYDROcodone-acetaminophen (NORCO) 5-325 MG tablet   Please call  [phone]  [facility] network   Please mark as URGENT REQUEST!!  Pt informed that MD is OOO.

## 2022-07-01 ENCOUNTER — Encounter: Payer: Self-pay | Admitting: Family

## 2022-07-01 ENCOUNTER — Ambulatory Visit: Payer: 59 | Admitting: Family

## 2022-07-01 VITALS — BP 101/66 | HR 77 | Temp 98.2°F | Ht 65.0 in | Wt 143.0 lb

## 2022-07-01 DIAGNOSIS — R053 Chronic cough: Secondary | ICD-10-CM

## 2022-07-01 MED ORDER — PREDNISONE 20 MG PO TABS: ORAL_TABLET | ORAL | 0 refills | Status: DC

## 2022-07-01 MED ORDER — AZITHROMYCIN 250 MG PO TABS: ORAL_TABLET | ORAL | 0 refills | Status: AC

## 2022-07-01 NOTE — Progress Notes (Signed)
Patient ID: Sarah Gay, female    DOB: Jun 20, 1965, 57 y.o.   MRN: [id number]  Chief Complaint  Patient presents with   Cough    Pt c/o cough, SOB ,fatigue and runny nose since yesterday morning. Has been getting worse. Has tried sinex spray and tylenol sinus which does not help.     HPI:      URI sx:  started a week ago, with fatigue, productive cough with clear mucus, having wheezing, SOB, denies fever.  Also having nasal drainage and congestion, has been using saline nasal spray. pt reports she stopped smoking several years ago, never on any inhalers.     Assessment & Plan:  1. Persistent cough - lungs clear; sending zpack, but advised pt to wait until Monday before taking as her sx are 95% likely to be viral and a zpack does not treat this. She should start the prednisone first along with continuing nasal saline mist tid and generic sudafed and/or OTC Robitussin syrup which will give her the most relief with her sx. Advised on use & SE of meds.  - azithromycin (ZITHROMAX) 250 MG tablet; Take 2 tablets on day 1, then 1 tablet daily on days 2 through 5  Dispense: 6 tablet; Refill: 0 - predniSONE (DELTASONE) 20 MG tablet; Take 2 pills in the morning with breakfast for 3 days, then 1 pill for 2 days  Dispense: 8 tablet; Refill: 0   Subjective:    Outpatient Medications Prior to Visit  Medication Sig Dispense Refill   ALPRAZolam (XANAX) 0.5 MG tablet Take 1 tablet (0.5 mg total) by mouth 3 (three) times daily as needed. 90 tablet 5   escitalopram (LEXAPRO) 5 MG tablet Take 2 tablets (10 mg total) by mouth daily. 180 tablet 0   [START ON 08/05/2022] HYDROcodone-acetaminophen (NORCO) 5-325 MG tablet Take 1 tablet by mouth every 6 (six) hours as needed for moderate pain. 120 tablet 0   hydrOXYzine (VISTARIL) 25 MG capsule TAKE 1 CAPSULE BY MOUTH  EVERY 4 HOURS AS NEEDED FOR ITCHING 180 capsule 0   levothyroxine (SYNTHROID) 50 MCG tablet Take 1 tablet (50 mcg total) by mouth daily before  breakfast. 90 tablet 3   Melatonin 5 MG TABS Take 1 each by mouth at bedtime.     methocarbamol (ROBAXIN) 500 MG tablet TAKE 1 TABLET BY MOUTH  EVERY 6 HOURS AS NEEDED FOR MUSCLE SPASM(S) 180 tablet 0   omeprazole (PRILOSEC) 40 MG capsule Take 1 capsule (40 mg total) by mouth daily. 90 capsule 3   SUMAtriptan (IMITREX) 100 MG tablet May repeat in 2 hours if headache persists or recurs. 9 tablet 9   fluticasone (FLONASE) 50 MCG/ACT nasal spray PLACE 1 SPRAY INTO BOTH NOSTRILS 2 (TWO) TIMES DAILY 16 g 3   Facility-Administered Medications Prior to Visit  Medication Dose Route Frequency Provider Last Rate Last Admin   0.9 %  sodium chloride infusion  500 mL Intravenous Continuous Milus Banister, MD       Past Medical History:  Diagnosis Date   Asthma    Depression    GERD (gastroesophageal reflux disease)    Migraines    Mononucleosis    as a child    Past Surgical History:  Procedure Laterality Date   BREAST SURGERY     bilateral augmentations   COLONOSCOPY  03/18/2016   per Dr. Ardis Hughs, clear, repeat in 10 yrs    SPINE SURGERY     repair C7 cervical disc  Allergies  Allergen Reactions   Cymbalta [Duloxetine Hcl] Other (See Comments)    Muscle twitching    Sulfonamide Derivatives       Objective:    Physical Exam Vitals and nursing note reviewed.  Constitutional:      Appearance: Normal appearance. She is not ill-appearing.     Interventions: Face mask in place.  HENT:     Right Ear: Tympanic membrane and ear canal normal.     Left Ear: Tympanic membrane and ear canal normal.     Nose:     Right Sinus: No frontal sinus tenderness.     Left Sinus: No frontal sinus tenderness.     Mouth/Throat:     Mouth: Mucous membranes are moist.     Pharynx: Posterior oropharyngeal erythema present. No pharyngeal swelling, oropharyngeal exudate or uvula swelling.     Tonsils: No tonsillar exudate or tonsillar abscesses.  Cardiovascular:     Rate and Rhythm: Normal rate and  regular rhythm.  Pulmonary:     Effort: Pulmonary effort is normal.     Breath sounds: Normal breath sounds.  Musculoskeletal:        General: Normal range of motion.  Lymphadenopathy:     Head:     Right side of head: No preauricular or posterior auricular adenopathy.     Left side of head: No preauricular or posterior auricular adenopathy.     Cervical: No cervical adenopathy.  Skin:    General: Skin is warm and dry.  Neurological:     Mental Status: She is alert.  Psychiatric:        Mood and Affect: Mood normal.        Behavior: Behavior normal.    BP 101/66 (BP Location: Left Arm, Patient Position: Sitting, Cuff Size: Normal)   Pulse 77   Temp 98.2 F (36.8 C) (Temporal)   Ht 5\' 5"  (1.651 m)   Wt 143 lb (64.9 kg)   SpO2 93%   BMI 23.80 kg/m  Wt Readings from Last 3 Encounters:  07/01/22 143 lb (64.9 kg)  05/27/22 140 lb (63.5 kg)  02/11/22 132 lb 8 oz (60.1 kg)      Jeanie Sewer, NP

## 2022-07-01 NOTE — Patient Instructions (Signed)
As discussed, start the prednisone today. You can also take over the counter generic Sudafed for your sinus symptoms. Continue to use the saline nasal spray several times per day. I would wait to start the Zpack until Monday as you will get the most benefit from the Prednisone as the Zpack is only helpful for bacterial infections. Using a humidifier overnight is also beneficial. Drink plenty of water!  Let us know if you are not better after another 5-6 days.

## 2022-08-02 ENCOUNTER — Telehealth: Payer: Self-pay | Admitting: Family Medicine

## 2022-08-02 MED ORDER — HYDROCODONE-ACETAMINOPHEN 5-325 MG PO TABS: 1.0000 | ORAL_TABLET | Freq: Four times a day (QID) | ORAL | 0 refills | Status: DC | PRN

## 2022-08-02 NOTE — Telephone Encounter (Signed)
I sent a 30 day supply to [facility]. She will need a PMV next month before any further refills can be written

## 2022-08-02 NOTE — Telephone Encounter (Signed)
Pt is calling and PA is need for escitalopram (LEXAPRO) 5 MG tablet  please send me med to   [facility]) - [place], [place] - [address] [place] Phone: [phone]  Fax: [phone]

## 2022-08-02 NOTE — Telephone Encounter (Signed)
Pt LOV was on 2//2024 Last refill was done on 06/06/2022 Please see the message attached

## 2022-08-02 NOTE — Telephone Encounter (Signed)
Pt is calling and her medicationHYDROcodone-acetaminophen (NORCO) 5-325 MG tablet  needs to go to  [facility]) - [place], [place] - [address] [place] Phone: [phone]  Fax: [phone]

## 2022-08-03 NOTE — Telephone Encounter (Signed)
Noted  

## 2022-08-03 NOTE — Telephone Encounter (Signed)
Spoke with the patient, informed her of the message below and an appt was scheduled for 5/15.

## 2022-08-08 ENCOUNTER — Other Ambulatory Visit (HOSPITAL_COMMUNITY): Payer: Self-pay

## 2022-08-09 ENCOUNTER — Other Ambulatory Visit (HOSPITAL_COMMUNITY): Payer: Self-pay

## 2022-08-09 NOTE — Telephone Encounter (Addendum)
Called pharmacy, was informed that the problem is that the insurance wants patient to use mail order. Patient will either have to get prescription through mail order or call insurance to see if she can still fill at the pharmacy. Per previous note, patient wants prescription sent to mail order

## 2022-08-10 ENCOUNTER — Other Ambulatory Visit: Payer: Self-pay

## 2022-08-10 DIAGNOSIS — F418 Other specified anxiety disorders: Secondary | ICD-10-CM

## 2022-08-10 MED ORDER — ESCITALOPRAM OXALATE 5 MG PO TABS: 10.0000 mg | ORAL_TABLET | Freq: Every day | ORAL | 0 refills | Status: DC

## 2022-08-10 NOTE — Telephone Encounter (Signed)
Left detailed message for pt, advised to call back with any questions

## 2022-08-10 NOTE — Telephone Encounter (Signed)
Rx was  sent to [facility] mail order

## 2022-08-17 ENCOUNTER — Encounter: Payer: Self-pay | Admitting: Family Medicine

## 2022-08-17 ENCOUNTER — Ambulatory Visit: Payer: 59 | Admitting: Family Medicine

## 2022-08-17 VITALS — BP 120/78 | HR 83 | Temp 98.5°F | Wt 141.2 lb

## 2022-08-17 DIAGNOSIS — J40 Bronchitis, not specified as acute or chronic: Secondary | ICD-10-CM | POA: Diagnosis not present

## 2022-08-17 MED ORDER — AMOXICILLIN-POT CLAVULANATE 875-125 MG PO TABS: 1.0000 | ORAL_TABLET | Freq: Two times a day (BID) | ORAL | 0 refills | Status: DC

## 2022-08-17 MED ORDER — METHYLPREDNISOLONE 4 MG PO TBPK: ORAL_TABLET | ORAL | 0 refills | Status: DC

## 2022-08-17 MED ORDER — ALBUTEROL SULFATE HFA 108 (90 BASE) MCG/ACT IN AERS: 2.0000 | INHALATION_SPRAY | RESPIRATORY_TRACT | 2 refills | Status: DC | PRN

## 2022-08-17 NOTE — Progress Notes (Signed)
   Subjective:    Patient ID: Sarah Gay, female    DOB: 12/30/1965, 57 y.o.   MRN: [id number]  HPI Here to follow up on an apparent bronchitis. On 07-01-22 she saw Acie Fredrickson NP for a cough that produced clear sputum. She was given a Zpack and some Prednisone. She felt better for a few weeks, but then the same symptoms returned. No fever or chest pain, but she does wheeze at times and she feels slightly SOB.    Review of Systems  Constitutional: Negative.   HENT:  Positive for congestion. Negative for ear pain, postnasal drip, sinus pressure and sore throat.   Eyes: Negative.   Respiratory:  Positive for cough, shortness of breath and wheezing.        Objective:   Physical Exam Constitutional:      Appearance: Normal appearance. She is not ill-appearing.  HENT:     Right Ear: Tympanic membrane, ear canal and external ear normal.     Left Ear: Tympanic membrane, ear canal and external ear normal.     Nose: Nose normal.     Mouth/Throat:     Pharynx: Oropharynx is clear.  Eyes:     Conjunctiva/sclera: Conjunctivae normal.  Pulmonary:     Effort: Pulmonary effort is normal.     Breath sounds: Wheezing present. No rhonchi or rales.  Lymphadenopathy:     Cervical: No cervical adenopathy.  Neurological:     Mental Status: She is alert.           Assessment & Plan:  Partially treated bronchitis. We will treat with 10 days of Augmentin. Add a Medrol dose pack and an albuterol inhaler. Recheck as needed.  Gershon Crane, MD

## 2022-08-18 ENCOUNTER — Telehealth: Payer: Self-pay | Admitting: Family Medicine

## 2022-08-18 NOTE — Telephone Encounter (Signed)
Pt was seen yesterday, 08/17/22.  Pt was prescribed albuterol (VENTOLIN HFA) 108 (90 Base) MCG/ACT inhaler  Her insurance [facility]) will not cover it.   Pt was told they would however cover the Proventil   "[employer] may need PA or something", as per Pt  Please send a new Rx for this alternative to:  [id number] [place], [place] - [address] Phone: [phone]  Fax: [phone]

## 2022-08-19 MED ORDER — VENTOLIN HFA 108 (90 BASE) MCG/ACT IN AERS: 2.0000 | INHALATION_SPRAY | RESPIRATORY_TRACT | 2 refills | Status: AC | PRN

## 2022-08-19 NOTE — Telephone Encounter (Signed)
Attempted to update pt. Left a voicemail to call us back.

## 2022-08-19 NOTE — Telephone Encounter (Signed)
I sent in the Ventolin

## 2022-08-23 ENCOUNTER — Telehealth: Payer: Self-pay | Admitting: Family Medicine

## 2022-08-23 NOTE — Telephone Encounter (Signed)
Spoke with pt stated that [facility]olin on [facility]26/22. Pt stated that if [facility] not [facility] the inhaled she will call the office to request for the alternative

## 2022-08-23 NOTE — Telephone Encounter (Signed)
Pt call and stated she is returning your call and want a call back. 

## 2022-08-23 NOTE — Telephone Encounter (Signed)
Pt.notified

## 2022-08-31 ENCOUNTER — Ambulatory Visit: Payer: 59 | Admitting: Family Medicine

## 2022-08-31 ENCOUNTER — Encounter: Payer: Self-pay | Admitting: Family Medicine

## 2022-08-31 VITALS — BP 110/74 | HR 87 | Temp 97.7°F | Wt 143.8 lb

## 2022-08-31 DIAGNOSIS — G43801 Other migraine, not intractable, with status migrainosus: Secondary | ICD-10-CM | POA: Diagnosis not present

## 2022-08-31 DIAGNOSIS — F1190 Opioid use, unspecified, uncomplicated: Secondary | ICD-10-CM | POA: Diagnosis not present

## 2022-08-31 MED ORDER — HYDROCODONE-ACETAMINOPHEN 5-325 MG PO TABS: 1.0000 | ORAL_TABLET | Freq: Four times a day (QID) | ORAL | 0 refills | Status: AC | PRN

## 2022-08-31 NOTE — Progress Notes (Signed)
   Subjective:    Patient ID: Sarah Gay, female    DOB: 1965/07/23, 57 y.o.   MRN: [id number]  HPI Here for pain management. She is doing well.    Review of Systems  Constitutional: Negative.   Neurological:  Positive for headaches.       Objective:   Physical Exam Constitutional:      Appearance: Normal appearance.  Neurological:     Mental Status: She is alert.           Assessment & Plan:  Pain management. Indication for chronic opioid: migraines Medication and dose: Norco 5-325 # pills per month: 120 Last UDS date: 02-11-22 Opioid Treatment Agreement signed (Y/N): 12-29-17 Opioid Treatment Agreement last reviewed with patient:  08-31-22 [facility] reviewed this encounter (include red flags): Yes Meds were refilled.  Gershon Crane, MD

## 2022-09-07 ENCOUNTER — Other Ambulatory Visit: Payer: Self-pay | Admitting: Family Medicine

## 2022-09-08 ENCOUNTER — Other Ambulatory Visit: Payer: Self-pay

## 2022-09-08 MED ORDER — SUMATRIPTAN SUCCINATE 100 MG PO TABS: ORAL_TABLET | ORAL | 9 refills | Status: DC

## 2022-09-08 NOTE — Telephone Encounter (Signed)
Pt LOV was on 08/31/22 Last refill was done on 02/11/22 Pt requests for refill on Xanax Please advise

## 2022-10-11 ENCOUNTER — Other Ambulatory Visit: Payer: Self-pay | Admitting: Family Medicine

## 2022-10-11 DIAGNOSIS — F418 Other specified anxiety disorders: Secondary | ICD-10-CM

## 2023-02-03 ENCOUNTER — Other Ambulatory Visit: Payer: Self-pay

## 2023-02-03 ENCOUNTER — Telehealth: Payer: Self-pay | Admitting: Family Medicine

## 2023-02-03 NOTE — Telephone Encounter (Signed)
Yes that change is fine.

## 2023-02-03 NOTE — Telephone Encounter (Signed)
Sarah Gay, Pharmacy Tech  [facility] [id number] Ref [id number]  levothyroxine (SYNTHROID) 50 MCG tablet  Change in manufacturer request to Lupin

## 2023-02-07 NOTE — Telephone Encounter (Signed)
Spoke with pt pharmacy advised that Dr Clent Ridges has approved the change to pt thyroid medication

## 2023-03-30 ENCOUNTER — Other Ambulatory Visit: Payer: Self-pay | Admitting: Family Medicine

## 2023-04-10 ENCOUNTER — Other Ambulatory Visit: Payer: Self-pay | Admitting: Family Medicine

## 2023-04-14 ENCOUNTER — Other Ambulatory Visit: Payer: Self-pay

## 2023-04-14 ENCOUNTER — Encounter: Payer: Self-pay | Admitting: Family Medicine

## 2023-04-14 ENCOUNTER — Ambulatory Visit (INDEPENDENT_AMBULATORY_CARE_PROVIDER_SITE_OTHER): Payer: 59 | Admitting: Family Medicine

## 2023-04-14 ENCOUNTER — Other Ambulatory Visit: Payer: Self-pay | Admitting: Family Medicine

## 2023-04-14 VITALS — BP 108/64 | HR 78 | Temp 98.2°F | Ht 65.0 in | Wt 139.4 lb

## 2023-04-14 DIAGNOSIS — E039 Hypothyroidism, unspecified: Secondary | ICD-10-CM

## 2023-04-14 DIAGNOSIS — F1190 Opioid use, unspecified, uncomplicated: Secondary | ICD-10-CM | POA: Diagnosis not present

## 2023-04-14 DIAGNOSIS — J40 Bronchitis, not specified as acute or chronic: Secondary | ICD-10-CM | POA: Diagnosis not present

## 2023-04-14 DIAGNOSIS — Z0000 Encounter for general adult medical examination without abnormal findings: Secondary | ICD-10-CM | POA: Diagnosis not present

## 2023-04-14 MED ORDER — AMOXICILLIN-POT CLAVULANATE 875-125 MG PO TABS: 1.0000 | ORAL_TABLET | Freq: Two times a day (BID) | ORAL | 0 refills | Status: DC

## 2023-04-14 MED ORDER — OMEPRAZOLE 40 MG PO CPDR: 40.0000 mg | DELAYED_RELEASE_CAPSULE | Freq: Every day | ORAL | 3 refills | Status: DC

## 2023-04-14 NOTE — Progress Notes (Signed)
Subjective:    Patient ID: Sarah Gay, female    DOB: 03/17/1966, 57 y.o.   MRN: [id number]  HPI Here for a well exam. She had been feeling well until 5 days ago when she developed chest congestion and a dry cough. No fever or SOB.    Review of Systems  Constitutional: Negative.   HENT: Negative.    Eyes: Negative.   Respiratory:  Positive for cough.   Cardiovascular: Negative.   Gastrointestinal: Negative.   Genitourinary:  Negative for decreased urine volume, difficulty urinating, dyspareunia, dysuria, enuresis, flank pain, frequency, hematuria, pelvic pain and urgency.  Musculoskeletal: Negative.   Skin: Negative.   Neurological:  Positive for headaches.  Psychiatric/Behavioral: Negative.         Objective:   Physical Exam Constitutional:      General: She is not in acute distress.    Appearance: Normal appearance. She is well-developed.  HENT:     Head: Normocephalic and atraumatic.     Right Ear: External ear normal.     Left Ear: External ear normal.     Nose: Nose normal.     Mouth/Throat:     Pharynx: No oropharyngeal exudate.  Eyes:     General: No scleral icterus.    Conjunctiva/sclera: Conjunctivae normal.     Pupils: Pupils are equal, round, and reactive to light.  Neck:     Thyroid: No thyromegaly.     Vascular: No JVD.  Cardiovascular:     Rate and Rhythm: Normal rate and regular rhythm.     Pulses: Normal pulses.     Heart sounds: Normal heart sounds. No murmur heard.    No friction rub. No gallop.  Pulmonary:     Effort: Pulmonary effort is normal. No respiratory distress.     Breath sounds: Normal breath sounds. No wheezing or rales.  Chest:     Chest wall: No tenderness.  Abdominal:     General: Bowel sounds are normal. There is no distension.     Palpations: Abdomen is soft. There is no mass.     Tenderness: There is no abdominal tenderness. There is no guarding or rebound.  Musculoskeletal:        General: No tenderness. Normal range  of motion.     Cervical back: Normal range of motion and neck supple.  Lymphadenopathy:     Cervical: No cervical adenopathy.  Skin:    General: Skin is warm and dry.     Findings: No erythema or rash.  Neurological:     General: No focal deficit present.     Mental Status: She is alert and oriented to person, place, and time.     Cranial Nerves: No cranial nerve deficit.     Motor: No abnormal muscle tone.     Coordination: Coordination normal.     Deep Tendon Reflexes: Reflexes are normal and symmetric. Reflexes normal.  Psychiatric:        Mood and Affect: Mood normal.        Behavior: Behavior normal.        Thought Content: Thought content normal.        Judgment: Judgment normal.           Assessment & Plan:  Well exam. We discussed diet and exercise. Get fasting labs. We will get a urine drug screen for her pain management program. She also has a bronchitis, and we will treat this with 10 days of Augmentin.  Gershon Crane, MD

## 2023-04-15 LAB — BASIC METABOLIC PANEL
BUN/Creatinine Ratio: 10 (ref 9–23)
BUN: 8 mg/dL (ref 6–24)
CO2: 23 mmol/L (ref 20–29)
Calcium: 9.4 mg/dL (ref 8.7–10.2)
Chloride: 107 mmol/L — ABNORMAL HIGH (ref 96–106)
Creatinine, Ser: 0.82 mg/dL (ref 0.57–1.00)
Glucose: 90 mg/dL (ref 70–99)
Potassium: 4.2 mmol/L (ref 3.5–5.2)
Sodium: 144 mmol/L (ref 134–144)
eGFR: 82 mL/min/{1.73_m2} (ref >59)

## 2023-04-15 LAB — LIPID PANEL
Chol/HDL Ratio: 3.1 {ratio} (ref 0.0–4.4)
Cholesterol, Total: 211 mg/dL — ABNORMAL HIGH (ref 100–199)
HDL: 67 mg/dL (ref >39)
LDL Chol Calc (NIH): 129 mg/dL — ABNORMAL HIGH (ref 0–99)
Triglycerides: 82 mg/dL (ref 0–149)
VLDL Cholesterol Cal: 15 mg/dL (ref 5–40)

## 2023-04-15 LAB — CBC WITH DIFFERENTIAL/PLATELET
Basophils Absolute: 0.1 10*3/uL (ref 0.0–0.2)
Basos: 1 %
EOS (ABSOLUTE): 0.2 10*3/uL (ref 0.0–0.4)
Eos: 3 %
Hematocrit: 42.5 % (ref 34.0–46.6)
Hemoglobin: 14.2 g/dL (ref 11.1–15.9)
Immature Grans (Abs): 0 10*3/uL (ref 0.0–0.1)
Immature Granulocytes: 0 %
Lymphocytes Absolute: 2.3 10*3/uL (ref 0.7–3.1)
Lymphs: 34 %
MCH: 30.7 pg (ref 26.6–33.0)
MCHC: 33.4 g/dL (ref 31.5–35.7)
MCV: 92 fL (ref 79–97)
Monocytes Absolute: 0.5 10*3/uL (ref 0.1–0.9)
Monocytes: 7 %
Neutrophils Absolute: 3.6 10*3/uL (ref 1.4–7.0)
Neutrophils: 55 %
Platelets: 325 10*3/uL (ref 150–450)
RBC: 4.62 x10E6/uL (ref 3.77–5.28)
RDW: 12.2 % (ref 11.7–15.4)
WBC: 6.6 10*3/uL (ref 3.4–10.8)

## 2023-04-15 LAB — HEPATIC FUNCTION PANEL
ALT: 12 [IU]/L (ref 0–32)
AST: 16 [IU]/L (ref 0–40)
Albumin: 4.5 g/dL (ref 3.8–4.9)
Alkaline Phosphatase: 84 [IU]/L (ref 44–121)
Bilirubin Total: <0.2 mg/dL (ref 0.0–1.2)
Bilirubin, Direct: <0.08 mg/dL (ref 0.00–0.40)
Total Protein: 6.6 g/dL (ref 6.0–8.5)

## 2023-04-15 LAB — TSH: TSH: 6.15 u[IU]/mL — ABNORMAL HIGH (ref 0.450–4.500)

## 2023-04-15 LAB — HEMOGLOBIN A1C
Est. average glucose Bld gHb Est-mCnc: 120 mg/dL
Hgb A1c MFr Bld: 5.8 % — ABNORMAL HIGH (ref 4.8–5.6)

## 2023-04-15 LAB — T4, FREE: Free T4: 1.32 ng/dL (ref 0.82–1.77)

## 2023-04-15 LAB — T3, FREE: T3, Free: 2.8 pg/mL (ref 2.0–4.4)

## 2023-04-16 LAB — DRUG SCREEN, URINE
Amphetamines, Urine: NEGATIVE ng/mL
Barbiturate screen, urine: NEGATIVE ng/mL
Benzodiazepine Quant, Ur: POSITIVE ng/mL — AB
Cannabinoid Quant, Ur: NEGATIVE ng/mL
Cocaine (Metab.): NEGATIVE ng/mL
Opiate Quant, Ur: NEGATIVE ng/mL
PCP Quant, Ur: NEGATIVE ng/mL

## 2023-12-05 ENCOUNTER — Other Ambulatory Visit: Payer: Self-pay | Admitting: Family Medicine

## 2024-01-23 ENCOUNTER — Other Ambulatory Visit: Payer: Self-pay

## 2024-01-23 DIAGNOSIS — F418 Other specified anxiety disorders: Secondary | ICD-10-CM

## 2024-01-23 MED ORDER — OMEPRAZOLE 40 MG PO CPDR
40.0000 mg | DELAYED_RELEASE_CAPSULE | Freq: Every day | ORAL | 3 refills | Status: AC
Start: 2024-01-23 — End: ?

## 2024-01-23 MED ORDER — ESCITALOPRAM OXALATE 5 MG PO TABS: 10.0000 mg | ORAL_TABLET | Freq: Every day | ORAL | 1 refills | Status: DC

## 2024-03-19 ENCOUNTER — Other Ambulatory Visit: Payer: Self-pay

## 2024-03-19 ENCOUNTER — Ambulatory Visit (INDEPENDENT_AMBULATORY_CARE_PROVIDER_SITE_OTHER): Admitting: Family Medicine

## 2024-03-19 ENCOUNTER — Encounter: Payer: Self-pay | Admitting: Family Medicine

## 2024-03-19 VITALS — BP 108/62 | HR 71 | Temp 98.4°F | Ht 65.0 in | Wt 142.2 lb

## 2024-03-19 DIAGNOSIS — G8929 Other chronic pain: Secondary | ICD-10-CM | POA: Diagnosis not present

## 2024-03-19 DIAGNOSIS — Z0000 Encounter for general adult medical examination without abnormal findings: Secondary | ICD-10-CM

## 2024-03-19 DIAGNOSIS — E039 Hypothyroidism, unspecified: Secondary | ICD-10-CM | POA: Diagnosis not present

## 2024-03-19 MED ORDER — SUMATRIPTAN SUCCINATE 100 MG PO TABS: ORAL_TABLET | ORAL | 11 refills | Status: AC

## 2024-03-19 MED ORDER — HYDROCODONE-ACETAMINOPHEN 5-325 MG PO TABS: 1.0000 | ORAL_TABLET | ORAL | 0 refills | Status: AC | PRN

## 2024-03-19 MED ORDER — LEVOTHYROXINE SODIUM 50 MCG PO TABS: 50.0000 ug | ORAL_TABLET | Freq: Every day | ORAL | 3 refills | Status: DC

## 2024-03-19 MED ORDER — ESCITALOPRAM OXALATE 20 MG PO TABS: 20.0000 mg | ORAL_TABLET | Freq: Every day | ORAL | 3 refills | Status: AC

## 2024-03-19 NOTE — Progress Notes (Signed)
 Subjective:    Patient ID: Sarah Gay, female    DOB: Dec 09, 1965, 58 y.o.   MRN: [id number]  HPI Here for a well exam. She has a few issues to discuss. First her depression and anxiety have not been well controlled lately, and she says her job has been very stressful. She has been taking 10 mg of Lexapro  daily for several years now. Also her migraines have become much more frequent. She is now averaging one migraine every week. The Sumatriptan  usually helps, but she has to stop what she is doing and lie down for it to work.    Review of Systems  Constitutional: Negative.   HENT: Negative.    Eyes: Negative.   Respiratory: Negative.    Cardiovascular: Negative.   Gastrointestinal: Negative.   Genitourinary:  Negative for decreased urine volume, difficulty urinating, dyspareunia, dysuria, enuresis, flank pain, frequency, hematuria, pelvic pain and urgency.  Musculoskeletal: Negative.   Skin: Negative.   Neurological:  Positive for headaches.  Psychiatric/Behavioral:  Positive for decreased concentration and dysphoric mood. Negative for agitation, behavioral problems, confusion, hallucinations, self-injury, sleep disturbance and suicidal ideas. The patient is nervous/anxious.        Objective:   Physical Exam Constitutional:      General: She is not in acute distress.    Appearance: Normal appearance. She is well-developed.  HENT:     Head: Normocephalic and atraumatic.     Right Ear: External ear normal.     Left Ear: External ear normal.     Nose: Nose normal.     Mouth/Throat:     Pharynx: No oropharyngeal exudate.  Eyes:     General: No scleral icterus.    Conjunctiva/sclera: Conjunctivae normal.     Pupils: Pupils are equal, round, and reactive to light.  Neck:     Thyroid: No thyromegaly.     Vascular: No JVD.  Cardiovascular:     Rate and Rhythm: Normal rate and regular rhythm.     Pulses: Normal pulses.     Heart sounds: Normal heart sounds. No murmur heard.     No friction rub. No gallop.  Pulmonary:     Effort: Pulmonary effort is normal. No respiratory distress.     Breath sounds: Normal breath sounds. No wheezing or rales.  Chest:     Chest wall: No tenderness.  Abdominal:     General: Bowel sounds are normal. There is no distension.     Palpations: Abdomen is soft. There is no mass.     Tenderness: There is no abdominal tenderness. There is no guarding or rebound.  Musculoskeletal:        General: No tenderness. Normal range of motion.     Cervical back: Normal range of motion and neck supple.  Lymphadenopathy:     Cervical: No cervical adenopathy.  Skin:    General: Skin is warm and dry.     Findings: No erythema or rash.  Neurological:     General: No focal deficit present.     Mental Status: She is alert and oriented to person, place, and time.     Cranial Nerves: No cranial nerve deficit.     Motor: No abnormal muscle tone.     Coordination: Coordination normal.     Deep Tendon Reflexes: Reflexes are normal and symmetric. Reflexes normal.  Psychiatric:        Mood and Affect: Mood normal.        Behavior: Behavior normal.  Thought Content: Thought content normal.        Judgment: Judgment normal.           Assessment & Plan:  Well exam. We discussed diet and exercise. Get fasting labs. For the depression and anxiety, we will increase the Lexapro  to 20 mg daily. She can still add Xanax  as needed. Since her migraines have become more frequent, I suggested she try taking a daily prophylactic medication like Topamax. She wants to read about this first, and she will let me know if she would like to try this.  Garnette Olmsted, MD

## 2024-03-21 ENCOUNTER — Other Ambulatory Visit: Payer: Self-pay | Admitting: Family Medicine

## 2024-03-22 LAB — SPECIMEN STATUS REPORT

## 2024-03-26 ENCOUNTER — Ambulatory Visit: Payer: Self-pay | Admitting: Family Medicine

## 2024-03-26 LAB — TSH: TSH: 8.46 u[IU]/mL — ABNORMAL HIGH (ref 0.450–4.500)

## 2024-03-26 LAB — LIPID PANEL W/O CHOL/HDL RATIO
Cholesterol, Total: 233 mg/dL — ABNORMAL HIGH (ref 100–199)
HDL: 85 mg/dL (ref >39)
LDL Chol Calc (NIH): 134 mg/dL — ABNORMAL HIGH (ref 0–99)
Triglycerides: 82 mg/dL (ref 0–149)
VLDL Cholesterol Cal: 14 mg/dL (ref 5–40)

## 2024-03-26 LAB — DRUG SCREEN ONLY 18, URINE, MEDICATION ASSISTED TREATMENT (MAT)
BENZO CLASS IA Scr Only: POSITIVE ng/mL — AB
Creatinine: 33 mg/dL (ref >=20)
Urine pH: 7.1 (ref 4.5–8.9)

## 2024-03-26 LAB — BASIC METABOLIC PANEL WITH GFR
BUN/Creatinine Ratio: 14 (ref 12–28)
BUN: 13 mg/dL (ref 8–27)
CO2: 24 mmol/L (ref 20–29)
Calcium: 9.7 mg/dL (ref 8.7–10.3)
Chloride: 101 mmol/L (ref 96–106)
Creatinine, Ser: 0.94 mg/dL (ref 0.57–1.00)
Glucose: 89 mg/dL (ref 70–99)
Potassium: 4.6 mmol/L (ref 3.5–5.2)
Sodium: 139 mmol/L (ref 134–144)
eGFR: 69 mL/min/1.73 (ref >59)

## 2024-03-26 LAB — CBC WITH DIFFERENTIAL/PLATELET
Basophils Absolute: 0.1 x10E3/uL (ref 0.0–0.2)
Basos: 1 %
EOS (ABSOLUTE): 0.2 x10E3/uL (ref 0.0–0.4)
Eos: 2 %
Hematocrit: 44.5 % (ref 34.0–46.6)
Hemoglobin: 14.6 g/dL (ref 11.1–15.9)
Immature Grans (Abs): 0 x10E3/uL (ref 0.0–0.1)
Immature Granulocytes: 0 %
Lymphocytes Absolute: 2.7 x10E3/uL (ref 0.7–3.1)
Lymphs: 35 %
MCH: 30.5 pg (ref 26.6–33.0)
MCHC: 32.8 g/dL (ref 31.5–35.7)
MCV: 93 fL (ref 79–97)
Monocytes Absolute: 0.5 x10E3/uL (ref 0.1–0.9)
Monocytes: 6 %
Neutrophils Absolute: 4.3 x10E3/uL (ref 1.4–7.0)
Neutrophils: 56 %
Platelets: 335 x10E3/uL (ref 150–450)
RBC: 4.78 x10E6/uL (ref 3.77–5.28)
RDW: 11.9 % (ref 11.7–15.4)
WBC: 7.7 x10E3/uL (ref 3.4–10.8)

## 2024-03-26 LAB — HEPATIC FUNCTION PANEL
ALT: 11 IU/L (ref 0–32)
AST: 18 IU/L (ref 0–40)
Albumin: 4.4 g/dL (ref 3.8–4.9)
Alkaline Phosphatase: 85 IU/L (ref 49–135)
Bilirubin Total: 0.3 mg/dL (ref 0.0–1.2)
Bilirubin, Direct: 0.08 mg/dL (ref 0.00–0.40)
Total Protein: 6.9 g/dL (ref 6.0–8.5)

## 2024-03-26 LAB — T3, FREE: T3, Free: 2.9 pg/mL (ref 2.0–4.4)

## 2024-03-26 LAB — HGB A1C W/O EAG: Hgb A1c MFr Bld: 5.9 % — ABNORMAL HIGH (ref 4.8–5.6)

## 2024-03-26 LAB — T4, FREE: Free T4: 1.07 ng/dL (ref 0.82–1.77)

## 2024-03-26 MED ORDER — LEVOTHYROXINE SODIUM 88 MCG PO TABS: 88.0000 ug | ORAL_TABLET | Freq: Every day | ORAL | 3 refills | Status: AC

## 2024-03-26 NOTE — Addendum Note (Signed)
 Addended by: JOHNNY SENIOR A on: 03/26/2024 09:49 AM   Modules accepted: Orders
